# Patient Record
Sex: Female | Born: 1970 | Race: Black or African American | Hispanic: No | Marital: Single | Smoking: Current every day smoker
Health system: Southern US, Community
[De-identification: ages and names within clinical notes are randomized; demographics above are authoritative.]

## PROBLEM LIST (undated history)

## (undated) DIAGNOSIS — B1920 Unspecified viral hepatitis C without hepatic coma: Secondary | ICD-10-CM

## (undated) DIAGNOSIS — C419 Malignant neoplasm of bone and articular cartilage, unspecified: Secondary | ICD-10-CM

## (undated) DIAGNOSIS — I10 Essential (primary) hypertension: Secondary | ICD-10-CM

## (undated) DIAGNOSIS — C801 Malignant (primary) neoplasm, unspecified: Secondary | ICD-10-CM

## (undated) HISTORY — PX: LEG SURGERY: SHX1003

## (undated) HISTORY — PX: HYSTEROTOMY: SHX1776

## (undated) HISTORY — DX: Malignant neoplasm of bone and articular cartilage, unspecified: C41.9

## (undated) HISTORY — DX: Malignant (primary) neoplasm, unspecified: C80.1

## (undated) HISTORY — DX: Unspecified viral hepatitis C without hepatic coma: B19.20

---

## 2019-02-06 ENCOUNTER — Inpatient Hospital Stay (HOSPITAL_COMMUNITY)
Admission: EM | Admit: 2019-02-06 | Discharge: 2019-02-12 | DRG: 123 | Disposition: A | Discharge status: Home / Self Care | Payer: Self-pay | Source: Ambulatory Visit | Attending: Family Medicine | Admitting: Family Medicine

## 2019-02-06 ENCOUNTER — Emergency Department (HOSPITAL_COMMUNITY): Payer: Medicaid Other

## 2019-02-06 ENCOUNTER — Other Ambulatory Visit: Payer: Self-pay

## 2019-02-06 ENCOUNTER — Encounter (HOSPITAL_COMMUNITY): Payer: Self-pay

## 2019-02-06 DIAGNOSIS — R7982 Elevated C-reactive protein (CRP): Secondary | ICD-10-CM | POA: Diagnosis present

## 2019-02-06 DIAGNOSIS — H4710 Unspecified papilledema: Secondary | ICD-10-CM | POA: Diagnosis present

## 2019-02-06 DIAGNOSIS — Z6830 Body mass index (BMI) 30.0-30.9, adult: Secondary | ICD-10-CM

## 2019-02-06 DIAGNOSIS — F1190 Opioid use, unspecified, uncomplicated: Secondary | ICD-10-CM

## 2019-02-06 DIAGNOSIS — F1120 Opioid dependence, uncomplicated: Secondary | ICD-10-CM

## 2019-02-06 DIAGNOSIS — E669 Obesity, unspecified: Secondary | ICD-10-CM | POA: Diagnosis present

## 2019-02-06 DIAGNOSIS — F1121 Opioid dependence, in remission: Secondary | ICD-10-CM | POA: Diagnosis present

## 2019-02-06 DIAGNOSIS — I10 Essential (primary) hypertension: Secondary | ICD-10-CM | POA: Diagnosis present

## 2019-02-06 DIAGNOSIS — H543 Unqualified visual loss, both eyes: Secondary | ICD-10-CM

## 2019-02-06 DIAGNOSIS — Z833 Family history of diabetes mellitus: Secondary | ICD-10-CM

## 2019-02-06 DIAGNOSIS — H5010 Unspecified exotropia: Secondary | ICD-10-CM | POA: Diagnosis present

## 2019-02-06 DIAGNOSIS — H469 Unspecified optic neuritis: Principal | ICD-10-CM | POA: Diagnosis present

## 2019-02-06 DIAGNOSIS — Z20828 Contact with and (suspected) exposure to other viral communicable diseases: Secondary | ICD-10-CM | POA: Diagnosis present

## 2019-02-06 DIAGNOSIS — L508 Other urticaria: Secondary | ICD-10-CM

## 2019-02-06 DIAGNOSIS — K760 Fatty (change of) liver, not elsewhere classified: Secondary | ICD-10-CM | POA: Diagnosis present

## 2019-02-06 DIAGNOSIS — H532 Diplopia: Secondary | ICD-10-CM | POA: Diagnosis present

## 2019-02-06 DIAGNOSIS — R748 Abnormal levels of other serum enzymes: Secondary | ICD-10-CM | POA: Diagnosis present

## 2019-02-06 DIAGNOSIS — E279 Disorder of adrenal gland, unspecified: Secondary | ICD-10-CM | POA: Diagnosis present

## 2019-02-06 DIAGNOSIS — H53483 Generalized contraction of visual field, bilateral: Secondary | ICD-10-CM | POA: Diagnosis present

## 2019-02-06 DIAGNOSIS — L500 Allergic urticaria: Secondary | ICD-10-CM | POA: Diagnosis not present

## 2019-02-06 DIAGNOSIS — H9319 Tinnitus, unspecified ear: Secondary | ICD-10-CM | POA: Diagnosis present

## 2019-02-06 DIAGNOSIS — F17210 Nicotine dependence, cigarettes, uncomplicated: Secondary | ICD-10-CM | POA: Diagnosis present

## 2019-02-06 HISTORY — DX: Essential (primary) hypertension: I10

## 2019-02-06 LAB — CBC WITH DIFFERENTIAL/PLATELET
Abs Immature Granulocytes: 0.02 10*3/uL (ref 0.00–0.07)
Basophils Absolute: 0 10*3/uL (ref 0.0–0.1)
Basophils Relative: 0 %
Eosinophils Absolute: 0.2 10*3/uL (ref 0.0–0.5)
Eosinophils Relative: 2 %
HCT: 40.4 % (ref 36.0–46.0)
Hemoglobin: 13.3 g/dL (ref 12.0–15.0)
Immature Granulocytes: 0 %
Lymphocytes Relative: 32 %
Lymphs Abs: 2.8 10*3/uL (ref 0.7–4.0)
MCH: 31.1 pg (ref 26.0–34.0)
MCHC: 32.9 g/dL (ref 30.0–36.0)
MCV: 94.6 fL (ref 80.0–100.0)
Monocytes Absolute: 0.6 10*3/uL (ref 0.1–1.0)
Monocytes Relative: 6 %
Neutro Abs: 5.3 10*3/uL (ref 1.7–7.7)
Neutrophils Relative %: 60 %
Platelets: 290 10*3/uL (ref 150–400)
RBC: 4.27 MIL/uL (ref 3.87–5.11)
RDW: 13.7 % (ref 11.5–15.5)
WBC: 8.9 10*3/uL (ref 4.0–10.5)
nRBC: 0 % (ref 0.0–0.2)

## 2019-02-06 LAB — COMPREHENSIVE METABOLIC PANEL
ALT: 40 U/L (ref 0–44)
AST: 45 U/L — ABNORMAL HIGH (ref 15–41)
Albumin: 3.9 g/dL (ref 3.5–5.0)
Alkaline Phosphatase: 187 U/L — ABNORMAL HIGH (ref 38–126)
Anion gap: 10 (ref 5–15)
BUN: 10 mg/dL (ref 6–20)
CO2: 26 mmol/L (ref 22–32)
Calcium: 9.3 mg/dL (ref 8.9–10.3)
Chloride: 100 mmol/L (ref 98–111)
Creatinine, Ser: 0.79 mg/dL (ref 0.44–1.00)
GFR calc Af Amer: >60 mL/min (ref >60)
GFR calc non Af Amer: >60 mL/min (ref >60)
Glucose, Bld: 105 mg/dL — ABNORMAL HIGH (ref 70–99)
Potassium: 3.5 mmol/L (ref 3.5–5.1)
Sodium: 136 mmol/L (ref 135–145)
Total Bilirubin: 0.9 mg/dL (ref 0.3–1.2)
Total Protein: 8.5 g/dL — ABNORMAL HIGH (ref 6.5–8.1)

## 2019-02-06 LAB — SEDIMENTATION RATE: Sed Rate: 55 mm/hr — ABNORMAL HIGH (ref 0–22)

## 2019-02-06 LAB — C-REACTIVE PROTEIN: CRP: 1.3 mg/dL — ABNORMAL HIGH (ref <1.0)

## 2019-02-06 MED ORDER — ENOXAPARIN SODIUM 40 MG/0.4ML ~~LOC~~ SOLN
40.0000 mg | SUBCUTANEOUS | Status: DC
  Administered 2019-02-08 – 2019-02-12 (×5): 40 mg via SUBCUTANEOUS
  Filled 2019-02-06 (×5): qty 0.4

## 2019-02-06 MED ORDER — ACETAMINOPHEN 650 MG RE SUPP: 650.0000 mg | Freq: Four times a day (QID) | RECTAL | Status: DC | PRN

## 2019-02-06 MED ORDER — METHADONE HCL 40 MG PO TBSO: 120.0000 mg | ORAL_TABLET | Freq: Every day | ORAL | Status: DC

## 2019-02-06 MED ORDER — GADOBUTROL 1 MMOL/ML IV SOLN
7.0000 mL | Freq: Once | INTRAVENOUS | Status: AC | PRN
  Administered 2019-02-06: 7 mL via INTRAVENOUS

## 2019-02-06 MED ORDER — IOHEXOL 300 MG/ML  SOLN
75.0000 mL | Freq: Once | INTRAMUSCULAR | Status: AC | PRN
  Administered 2019-02-06: 75 mL via INTRAVENOUS

## 2019-02-06 MED ORDER — POLYETHYLENE GLYCOL 3350 17 G PO PACK: 17.0000 g | PACK | Freq: Every day | ORAL | Status: DC | PRN

## 2019-02-06 MED ORDER — ACETAMINOPHEN 325 MG PO TABS
650.0000 mg | ORAL_TABLET | Freq: Four times a day (QID) | ORAL | Status: DC | PRN
  Administered 2019-02-07 – 2019-02-09 (×3): 650 mg via ORAL
  Filled 2019-02-06 (×3): qty 2

## 2019-02-06 NOTE — ED Notes (Signed)
Dr. Leonel Ramsay has not seen pt and referred me to Margarita Mail.

## 2019-02-06 NOTE — ED Notes (Signed)
Consent for Dr. Ashok Cordia placed in Medical records drawer

## 2019-02-06 NOTE — ED Notes (Signed)
Patient transported to MRI 

## 2019-02-06 NOTE — ED Notes (Signed)
Notified Mike Craze

## 2019-02-06 NOTE — ED Notes (Signed)
Two unsuccessful IV attempts. Pt has IV drug use hx

## 2019-02-06 NOTE — ED Notes (Signed)
Radiology called and indicated they had not been able to reach Dr Ashok Cordia and that he would need to call Dr Nyoka Cowden at [phone]. Attempting to find who care of this pt was handed off to so that I can let them know.

## 2019-02-06 NOTE — ED Provider Notes (Signed)
[facility] Provider Note   CSN: [id number] Arrival date & time: 02/06/19  0818     History   Chief Complaint No chief complaint on file.   HPI Yanet Balliet is a 48 y.o. female presents for evaluation of gradual onset, progressively worsening vision changes.  She reports that around 1 month ago she developed diplopia in the right eye which is now progressed to the left eye.  She reports that she has decreased/absent peripheral vision bilaterally.  Denies eye pain, headaches, numbness or weakness of the extremities, chest pain, shortness of breath, abdominal pain, nausea, or vomiting.  She does tell me that her vision changes acutely worsened 1 week ago.  She was seen by her ophthalmologist Dr. Manuella Ghazi yesterday who obtained blood work and reevaluated her today.  She reports that "he did not like that some of my blood work was abnormal "and he sent her here for further evaluation and imaging.  Per paperwork brought in by the patient from Dr. Trena Platt office, the patient was actually initially seen on 10/23/2018 with bilateral disc edema, right worse than left.  She was originally just to show up for follow-up 3 weeks later but canceled.  She is to get blood work and then followed up yesterday.  On yesterday's exam the patient had disc pallor on the right from chronic disc edema and new and worsening disc edema on the left.  Also noted to have progressive visual field loss.  Blood work positive for RF and ACE with elevated calcium and alk phos concerning for sarcoidosis as cause.  She was sent to the ED for MRI of brain and orbit with and without contrast to rule out neurosarcoidosis, mass, or other acute abnormality.  He recommends neurology consult and possible admission to the hospital for IV steroids.     The history is provided by the patient and medical records.    History reviewed. No pertinent past medical history.  Patient Active Problem List   Diagnosis Date Noted   Visual loss, both eyes 02/06/2019    History reviewed. No pertinent surgical history.   OB History   No obstetric history on file.      Home Medications    Prior to Admission medications   Medication Sig Start Date End Date Taking? Authorizing Provider  acetaminophen (TYLENOL) 500 MG tablet Take 1,000 mg by mouth every 6 (six) hours as needed for headache (pain).   Yes [provider]  APPLE CIDER VINEGAR PO Take 1 tablet by mouth daily.   Yes [provider]  METHADONE HCL PO Take 120 mg by mouth daily.   Yes [provider]  Multiple Vitamin (MULTIVITAMIN WITH MINERALS) TABS tablet Take 1 tablet by mouth daily.   Yes [provider]    Family History No family history on file.  Social History Social History   Tobacco Use   Smoking status: Not on file  Substance Use Topics   Alcohol use: Not on file   Drug use: Not on file     Allergies   Shellfish allergy   Review of Systems Review of Systems  Constitutional: Negative for chills and fever.  Eyes: Positive for visual disturbance. Negative for photophobia.  Respiratory: Negative for shortness of breath.   Cardiovascular: Negative for chest pain.  Gastrointestinal: Negative for abdominal pain, nausea and vomiting.  Neurological: Negative for weakness, light-headedness, numbness and headaches.  All other systems reviewed and are negative.    Physical  Exam Updated Vital Signs BP (!) 142/78 (BP Location: Right Arm)    Pulse 78    Temp 98.8 F (37.1 C) (Oral)    Resp 16    Ht 5' 7" (1.702 m)    Wt 89.2 kg    SpO2 99%    BMI 30.80 kg/m   Physical Exam Vitals signs and nursing note reviewed.  Constitutional:      General: She is not in acute distress.    Appearance: She is well-developed.  HENT:     Head: Normocephalic and atraumatic.  Eyes:     General:        Right eye: No discharge.        Left eye: No discharge.     Extraocular Movements:  Extraocular movements intact.     Conjunctiva/sclera: Conjunctivae normal.     Pupils: Pupils are equal, round, and reactive to light.     Comments: No chemosis, proptosis, or consensual photophobia.  No restriction or pain with EOMs.  Bilateral peripheral field deficit noted.  Neck:     Vascular: No JVD.     Trachea: No tracheal deviation.  Cardiovascular:     Rate and Rhythm: Normal rate.  Pulmonary:     Effort: Pulmonary effort is normal.  Abdominal:     General: There is no distension.  Skin:    General: Skin is warm and dry.     Findings: No erythema.  Neurological:     Mental Status: She is alert.  Psychiatric:        Behavior: Behavior normal.      ED Treatments / Results  Labs (all labs ordered are listed, but only abnormal results are displayed) Labs Reviewed  COMPREHENSIVE METABOLIC PANEL - Abnormal; Notable for the following components:      Result Value   Glucose, Bld 105 (*)    Total Protein 8.5 (*)    AST 45 (*)    Alkaline Phosphatase 187 (*)    All other components within normal limits  SEDIMENTATION RATE - Abnormal; Notable for the following components:   Sed Rate 55 (*)    All other components within normal limits  C-REACTIVE PROTEIN - Abnormal; Notable for the following components:   CRP 1.3 (*)    All other components within normal limits  COMPREHENSIVE METABOLIC PANEL - Abnormal; Notable for the following components:   Potassium 3.2 (*)    Glucose, Bld 101 (*)    Alkaline Phosphatase 170 (*)    All other components within normal limits  SARS CORONAVIRUS 2 (TAT 6-24 HRS)  CSF CULTURE  GRAM STAIN  CBC WITH DIFFERENTIAL/PLATELET  GLUCOSE, CSF  PROTEIN, CSF  ANGIOTENSIN CONVERTING ENZYME, CSF  CSF CELL COUNT WITH DIFFERENTIAL  CSF CELL COUNT WITH DIFFERENTIAL  HIV ANTIBODY (ROUTINE TESTING W REFLEX)  VDRL, CSF  RPR  GAMMA GT    EKG None  Radiology Ct Angio Head W Or Wo Contrast  Result Date: 02/07/2019 CLINICAL DATA:  Concern for  vasculitis.  Blurry vision for 2 months. EXAM: CT ANGIOGRAPHY HEAD TECHNIQUE: Multidetector CT imaging of the head was performed using the standard protocol during bolus administration of intravenous contrast. Multiplanar CT image reconstructions and MIPs were obtained to evaluate the vascular anatomy. CONTRAST:  64m OMNIPAQUE IOHEXOL 350 MG/ML SOLN COMPARISON:  Brain MRI 02/06/2019 FINDINGS: POSTERIOR CIRCULATION: --Vertebral arteries: Diminutive right vertebral artery terminates in PICA. Left V4 segment is normal. --Posterior inferior cerebellar arteries (PICA): Normal --Anterior inferior cerebellar arteries (AICA): Patent origins  from the basilar artery. --Basilar artery: Normal. --Superior cerebellar arteries: Normal. --Posterior cerebral arteries: Normal. Both originate from the basilar artery. Posterior communicating arteries (p-comm) are diminutive or absent. ANTERIOR CIRCULATION: --Intracranial internal carotid arteries: Normal. --Anterior cerebral arteries (ACA): Normal. Both A1 segments are present. Patent anterior communicating artery (a-comm). --Middle cerebral arteries (MCA): Normal. Trace amount of air in the right lateral ventricle frontal horn. This is an incidental finding that may be seen following lumbar puncture. IMPRESSION: 1. No evidence for CNS vasculitis. 2. No emergent large vessel occlusion or flow-limiting stenosis. Electronically Signed   By: Ulyses Jarred M.D.   On: 02/07/2019 03:08   Ct Chest W Contrast  Result Date: 02/06/2019 CLINICAL DATA:  Sarcoid. EXAM: CT CHEST WITH CONTRAST TECHNIQUE: Multidetector CT imaging of the chest was performed during intravenous contrast administration. CONTRAST:  77m OMNIPAQUE IOHEXOL 300 MG/ML  SOLN COMPARISON:  None. FINDINGS: Cardiovascular: Heart size is normal. There is no significant pericardial effusion. The thoracic aorta is unremarkable. Mediastinum/Nodes: --No mediastinal or hilar lymphadenopathy. --No axillary lymphadenopathy. --No  supraclavicular lymphadenopathy. --Normal thyroid gland. --The esophagus is unremarkable Lungs/Pleura: No pulmonary nodules or masses. No pleural effusion or pneumothorax. No focal airspace consolidation. No focal pleural abnormality. Upper Abdomen: There is hepatic steatosis. There is a 2.6 x 2.1 cm left adrenal mass. Musculoskeletal: No chest wall abnormality. No acute or significant osseous findings. Review of the MIP images confirms the above findings. IMPRESSION: 1. No acute abnormality detected.  No evidence for sarcoidosis. 2. Hepatic steatosis. 3. 2.6 x 2.1 cm left adrenal mass, incompletely characterized on this single phase study. Further evaluation with a nonemergent outpatient adrenal protocol CT or MRI is recommended. Electronically Signed   By: CConstance HolsterM.D.   On: 02/06/2019 22:06   Mr BJeri CosAnd Wo Contrast  Result Date: 02/06/2019 CLINICAL DATA:  48year old female with 2 months of blurred vision. Referred by ophthalmology, possible neurosarcoidosis. EXAM: MRI HEAD AND ORBITS WITHOUT AND WITH CONTRAST TECHNIQUE: Multiplanar, multiecho pulse sequences of the brain and surrounding structures were obtained without and with intravenous contrast. Multiplanar, multiecho pulse sequences of the orbits and surrounding structures were obtained including fat saturation techniques, before and after intravenous contrast administration. CONTRAST:  759mGADAVIST GADOBUTROL 1 MMOL/ML IV SOLN COMPARISON:  Report of 09/29/1999 sinus CT (no images available). FINDINGS: MRI HEAD FINDINGS Brain: Partially empty sella. No convincing restricted diffusion. No midline shift, mass effect, or evidence of intracranial mass lesion. Prominent ventricle size for age, although the temporal horns remain diminutive. No chronic cerebral blood products identified, and no definite cortical encephalomalacia. However there is widespread abnormal cerebral white matter T2 and FLAIR hyperintensity, with similar signal  heterogeneity in the bilateral deep gray nuclei. Mild patchy T2 hyperintensity in the pons more so on the left. No cortical edema/gyral swelling identified. No abnormal enhancement identified.  No dural thickening identified. Vascular: Major intracranial vascular flow voids are preserved; the distal left vertebral artery appears dominant and the right diminutive or absent. Major dural venous sinuses are enhancing and appear to be patent. Skull and upper cervical spine: Negative visible cervical spine aside from degenerative disc bulging. Mildly heterogeneous marrow signal is within normal limits. Degenerative synovial type cyst of the left TMJ incidentally noted (7 millimeters series 24, image 3). Other: Visible internal auditory structures appear normal. Mastoids are clear. MRI ORBITS FINDINGS Orbits: No suprasellar mass effect. The optic chiasm appears normal. The optic nerves appear symmetric and without abnormal T2 hyperintensity or enhancement of the substance of  the nerves. There is bilateral optic nerve root sleeve CSF. No intraorbital inflammation or mass. Lacrimal glands and extraocular muscles appear symmetric and within normal limits. Normal cavernous sinus. Globes appear symmetric and normal aside from mildly disconjugate gaze. Soft tissues: Superficial and deep soft tissue spaces of the face appear negative. IMPRESSION: 1. Essentially negative MRI appearance of the orbits (mildly disconjugate gaze, and CSF in both optic nerve root sleeves, see #3). 2. Highly advanced for age but nonspecific signal abnormality scattered in the bilateral cerebral white matter and deep gray nuclei. Comparatively minor involvement of the brainstem. No associated enhancement or dural thickening, arguing against neurosarcoidosis. Although CSF analysis for Ace level may be valuable. Other differential considerations include accelerated/hereditary small vessel ischemia, sequelae of hypercoagulable state, vasculitis, prior  infection or less likely demyelination. 3. Superimposed partially empty sella appearance, along with an effaced appearance of the transverse/sigmoid sinus junctions and capacious optic nerve root sleeves. Consider also idiopathic intracranial hypertension (pseudotumor cerebri). Electronically Signed   By: Genevie Ann M.D.   On: 02/06/2019 18:53   Mr Rosealee Albee YB Contrast  Result Date: 02/06/2019 CLINICAL DATA:  48 year old female with 2 months of blurred vision. Referred by ophthalmology, possible neurosarcoidosis. EXAM: MRI HEAD AND ORBITS WITHOUT AND WITH CONTRAST TECHNIQUE: Multiplanar, multiecho pulse sequences of the brain and surrounding structures were obtained without and with intravenous contrast. Multiplanar, multiecho pulse sequences of the orbits and surrounding structures were obtained including fat saturation techniques, before and after intravenous contrast administration. CONTRAST:  18m GADAVIST GADOBUTROL 1 MMOL/ML IV SOLN COMPARISON:  Report of 09/29/1999 sinus CT (no images available). FINDINGS: MRI HEAD FINDINGS Brain: Partially empty sella. No convincing restricted diffusion. No midline shift, mass effect, or evidence of intracranial mass lesion. Prominent ventricle size for age, although the temporal horns remain diminutive. No chronic cerebral blood products identified, and no definite cortical encephalomalacia. However there is widespread abnormal cerebral white matter T2 and FLAIR hyperintensity, with similar signal heterogeneity in the bilateral deep gray nuclei. Mild patchy T2 hyperintensity in the pons more so on the left. No cortical edema/gyral swelling identified. No abnormal enhancement identified.  No dural thickening identified. Vascular: Major intracranial vascular flow voids are preserved; the distal left vertebral artery appears dominant and the right diminutive or absent. Major dural venous sinuses are enhancing and appear to be patent. Skull and upper cervical spine: Negative  visible cervical spine aside from degenerative disc bulging. Mildly heterogeneous marrow signal is within normal limits. Degenerative synovial type cyst of the left TMJ incidentally noted (7 millimeters series 24, image 3). Other: Visible internal auditory structures appear normal. Mastoids are clear. MRI ORBITS FINDINGS Orbits: No suprasellar mass effect. The optic chiasm appears normal. The optic nerves appear symmetric and without abnormal T2 hyperintensity or enhancement of the substance of the nerves. There is bilateral optic nerve root sleeve CSF. No intraorbital inflammation or mass. Lacrimal glands and extraocular muscles appear symmetric and within normal limits. Normal cavernous sinus. Globes appear symmetric and normal aside from mildly disconjugate gaze. Soft tissues: Superficial and deep soft tissue spaces of the face appear negative. IMPRESSION: 1. Essentially negative MRI appearance of the orbits (mildly disconjugate gaze, and CSF in both optic nerve root sleeves, see #3). 2. Highly advanced for age but nonspecific signal abnormality scattered in the bilateral cerebral white matter and deep gray nuclei. Comparatively minor involvement of the brainstem. No associated enhancement or dural thickening, arguing against neurosarcoidosis. Although CSF analysis for Ace level may be valuable. Other differential considerations  include accelerated/hereditary small vessel ischemia, sequelae of hypercoagulable state, vasculitis, prior infection or less likely demyelination. 3. Superimposed partially empty sella appearance, along with an effaced appearance of the transverse/sigmoid sinus junctions and capacious optic nerve root sleeves. Consider also idiopathic intracranial hypertension (pseudotumor cerebri). Electronically Signed   By: Genevie Ann M.D.   On: 02/06/2019 18:53    Procedures Procedures (including critical care time)  Medications Ordered in ED Medications  enoxaparin (LOVENOX) injection 40 mg (0  mg Subcutaneous Hold 02/07/19 1000)  acetaminophen (TYLENOL) tablet 650 mg (has no administration in time range)    Or  acetaminophen (TYLENOL) suppository 650 mg (has no administration in time range)  polyethylene glycol (MIRALAX / GLYCOLAX) packet 17 g (has no administration in time range)  methadone (DOLOPHINE) tablet 120 mg (has no administration in time range)  potassium chloride SA (K-DUR) CR tablet 40 mEq (has no administration in time range)  lidocaine (PF) (XYLOCAINE) 1 % injection (has no administration in time range)  gadobutrol (GADAVIST) 1 MMOL/ML injection 7 mL (7 mLs Intravenous Contrast Given 02/06/19 1618)  iohexol (OMNIPAQUE) 300 MG/ML solution 75 mL (75 mLs Intravenous Contrast Given 02/06/19 2145)  iohexol (OMNIPAQUE) 350 MG/ML injection 75 mL (75 mLs Intravenous Contrast Given 02/07/19 0253)     Initial Impression / Assessment and Plan / ED Course  I have reviewed the triage vital signs and the nursing notes.  Pertinent labs & imaging results that were available during my care of the patient were reviewed by me and considered in my medical decision making (see chart for details).        Patient presenting sent from ophthalmologist for evaluation of progressively worsening bilateral vision loss reportedly worsening this week.  She is afebrile, persistently hypertensive in the ED.  Vital signs otherwise stable.  She is nontoxic in appearance.  She has peripheral field deficit reports blurred vision and diplopia bilaterally.  States that she is "bumping into things ".  Tells me that she waited to have her symptoms evaluated because she was afraid of what was causing her symptoms.  Will obtain lab work and order MRI brain and orbits with and without contrast as recommended by ophthalmology.  MRI shows highly advanced for age but nonspecific signal abnormality scattered in the bilateral cerebral white matter and deep gray nuclei, no diagnostic findings suggestive of  neurosarcoidosis.   7:47 PM CONSULT: Spoke with Dr. Leonel Ramsay with neurology who reviewed the patient's images; advises that the patient's findings are nonspecific.  He recommends obtaining an LP to obtain CSF ACE and opening pressure.  If opening pressure is high, would be reasonable to start acetazolamide 500 mg twice daily.  He recommends follow-up with Dr. Arlice Colt with neurology.   On reevaluation patient resting comfortably.  She is frustrated that we have not found a definite source of her vision loss.  She states that she does not have anyone to take her home and that it is unsafe for her to ambulate due to her vision loss.  Dr. Ashok Cordia attempted LP without success.  Will need to be performed by IR.  9:00PM Signed out care to oncoming provider PA Harris.  Pending LP by IR.  May require admission or at least case management/social work consult due to poor social support and lack of resources and unsafe conditions due to her vision changes.  Final Clinical Impressions(s) / ED Diagnoses   Final diagnoses:  Vision loss, bilateral    ED Discharge Orders    None  Renita Papa, PA-C 02/07/19 1109    Lajean Saver, MD 02/07/19 [id number]

## 2019-02-06 NOTE — ED Notes (Signed)
Admitting provider at bedside.

## 2019-02-06 NOTE — Consult Note (Signed)
Neurology Consultation Reason for Consult: Visual change Referring Physician: Amada Kingfisher  CC: Visual change  History is obtained from: Patient  HPI: Annette Jennings is a 47 y.o. female presenting with a history of several months of visual decline, though much more pronounced over the past couple of weeks.  She saw Dr. Manuella Ghazi a couple of months ago, however but was lost to follow-up due to the fact that she did not yet have Medicaid and she was awaiting this.  When she did get Medicaid she resumed seeing him he noticed optic nerve head edema.  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   ROS: A 14 point ROS was performed and is negative except as noted in the HPI.   Past medical history:  History of drug abuse, currently in remission  Family history: No history of autoimmune disease that she knows of  Social History: She does have a history of IV drug abuse, as well as alcohol, but has not used in the past year She does continue to smoke  Exam: Current vital signs: BP (!) 169/101   Pulse 81   Temp 98.5 F (36.9 C) (Oral)   Resp 20   SpO2 96%  Vital signs in last 24 hours: Temp:  [98.5 F (36.9 C)] 98.5 F (36.9 C) (09/19 0828) Pulse Rate:  [78-89] 81 (09/19 2230) Resp:  [18-20] 20 (09/19 1400) BP: (139-179)/(97-115) 169/101 (09/19 2232) SpO2:  [89 %-100 %] 96 % (09/19 2230)   Physical Exam  Constitutional: Appears well-developed and well-nourished.  Psych: Affect appropriate to situation Eyes: No scleral injection HENT: No OP obstrucion Head: Normocephalic.  Cardiovascular: Normal rate and regular rhythm.  Respiratory: Effort normal, non-labored breathing GI: Soft.  No distension. There is no tenderness.  Skin: WDI  Neuro: Mental Status: Patient is awake, alert, oriented to person, place, month, year, and situation. Patient is able to give a clear and coherent history. No signs of aphasia or neglect Cranial Nerves: II: She has severe  restriction of fields, essentially to the fovea, bilaterally. Pupils are equal, round, and reactive to light.   III,IV, VI: She has a right exotropia, but when tested independently both eyes have full EOM V: Facial sensation is symmetric to temperature VII: Facial movement is symmetric.  VIII: hearing is intact to voice X: Uvula elevates symmetrically XI: Shoulder shrug is symmetric. XII: tongue is midline without atrophy or fasciculations.  Motor: Tone is normal. Bulk is normal. 5/5 strength was present in all four extremities.  Sensory: Sensation is symmetric to light touch and temperature in the arms and legs. Deep Tendon Reflexes: 2+ and symmetric in the biceps and patellae.  Cerebellar: FNF intact bilaterally  I have reviewed labs from Dr. Manuella Ghazi and the results pertinent to this consultation are: Ana,dsdna, rnp, smith, anti scl70, ssa, ssb, antichromatin, jo-1,centromere b, bartonella, cmv were sent and are pending  RF 14.5, upper limit normal 13.9 ACE 103, ULN 82  Here an ESR, CRP were checked, ESR is 55 and CRP is 1.3  I have reviewed the images obtained: MRI brain-optic nerve edema with partially empty sella.  Impression: 48 yo F with bilateral optic nerve edema with constriction of visual fields.  Though she does not have headaches, I do think idiopathic intracranial hypertension needs to be considered, and an LP was attempted in the ER but was unsuccessful.  This is now scheduled for the morning with radiology.  Without typical imaging changes, or findings on chest CT, I  think sarcoidosis is slightly less likely, however see if assays would be reasonable.  With her history of IV drug abuse, I do think that syphilis is a consideration and ocular manifestations can present as papilledema.  This can also cause extensive T2 changes well.  She does have hypertension, and therefore T2 changes may be simply due to underlying small vessel disease.  With the possibility raised of a  vasculitis, I do think a CTA head would be reasonable, though without headaches, I think this is much less likely.  Other autoimmune disease is possible, and she had an extensive serological evaluation sent by her ophthalmologist.  Lyme and Bartonella have also been sent.  Recommendations: 1) CTA head 2) LP for opening pressure, cells, glucose, CSF ACE, CSF VDRL 3) await results of Lyme titers, Bartonella titers 4) RPR, HIV  Roland Rack, MD [facility] [phone]  If 7pm- 7am, please page neurology on call as listed in [place].

## 2019-02-06 NOTE — ED Notes (Signed)
EDP, Dr. Ashok Cordia attempted two LP before ordering assistance from Radiology.

## 2019-02-06 NOTE — ED Provider Notes (Signed)
9:50 PM  Patient taken signout from [place].  Here with worsening vision problems over 3 weeks, acutely worsening over the past week and having significant visual deficits.  She was sent in by Dr. Manuella Ghazi for further management who is concern for potential ocular sarcoid.  Patient has been seen by Dr. Leonel Ramsay who recommends LP. EDPs attempted twice without success and patient will need IR lumbar puncture.    Spoke with Dr. Nyoka Cowden who is on-call for interventional radiology.  He has that the patient be admitted and he will do the LP in the morning.  Considering her multiple issues including her sudden loss of vision over the past week, poor social network patient will need likely OT PT evaluations.  She lives by herself. I have informed Dr. Leonel Ramsay that she will be admitted by the family medicine service overnight.  Neurology will follow along with the patient.   Margarita Mail, PA-C 02/07/19 0031    Quintella Reichert, MD 02/08/19 [phone]

## 2019-02-06 NOTE — H&P (Signed)
[facility] Admission History and Physical Service Pager: [phone]  Patient name: Annette Jennings Medical record number: [id number] Date of birth: 1970-09-07 Age: 48 y.o. Gender: female  Primary Care Provider: Patient, No Pcp Per Consultants: IR, neurology Code Status: Full Preferred Emergency Contact: Annette Jennings, brother, [phone]  Chief Complaint: Visual changes  Assessment and Plan: Annette Jennings is a 48 y.o. female presenting with gradual onset, progressively worsening bilateral vision loss. PMH is significant for elevated blood pressure, previous substance abuse, chronic methadone use.   Bilateral Vision Loss w/ Optic Disc Edema: Acute on chronic, progressively worsening, gradual.  Several month history of R peripheral vision loss with acute 1 week history of worsening L visual changes, in the setting of known bilateral disc edema on ophthalmology evaluation. Worsening edema noted in clinic yesterday with RF and ACE positive through labs performed by ophthalmology team, sent to ED today for brain imaging with concerns for sarcoidosis. Reassuringly, neurologically intact on exam with preserved extraocular motion and reactive pupils bilaterally. Tunnel vision on visual field testing.  CRP and ESR elevated.  MRI brain/orbits with normal appearance of orbits and no masses, ruling out precipitating intracranial mass.  However, did note nonspecific signal abnormality scattered throughout cerebral white matter and deep gray nuclei that could be concerning for sarcoidosis, but without any concurrent enhancement or dural thickening making neurosarcoidosis less likely.  Additionally, CT chest without evidence of sarcoidosis.  Could consider pseudotumor cerebri with worsening optic disc edema/visual changes, superimposed partially empty sella appearance on MRI, and no appreciable etiology on imaging thus far, but would have expected associated headaches.  Less  likely small vessel ischemia, vasculitis, previous infection as indicated through imaging, however will remain on differential.  No concern for precipitating CVA. Will proceed with LP to further assess for infection, sarcoidosis, and IIH.   - Admit to [facility], attending Dr. Erin Jennings - IR consulted in the ED for LP, will be performed tomorrow morning, 9/20 - Neurology, Dr. Leonel Jennings, consulted in the ED, will evaluate pending LP results - CSF culture/protein/cell count/glucose and CSF ACE orders already placed - Monitor visual changes - OT eval  Elevated blood pressure: Suspect chronic.  SBP 140-170s since arrival.  Asymptomatic. Low suspicion for longstanding hypertension precipitating visual changes as above.  Renal function preserved, Cr 0.79 on admit.  No previous diagnosis of hypertension, however has been elevated in the past per patient's report via ED visits.  Suspect chronic, however will continue to monitor. - Monitor BP - If remains elevated, will consider starting controlling therapy on 9/20 - CMP in a.m. - Case management consult for establishing PCP  Previous substance abuse, methadone use: Stable. 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  Daily methadone use through [facility] in [place]. - Dosage verified with clinic through pharmacy team on 9/19 - Methadone 120 mg nightly  Left adrenal mass on CT: Incidental finding of a 2.6 x 2.1 cm left adrenal mass.  - Recommend CT/MRI outpatient for further evaluation   Elevated alkaline phosphatase: Alk phos 187. Will repeat fasting tomorrow am, 9/20.  If persistently elevated, will consider obtaining GGT for further assessment.  Bilirubin and AST/ALT unremarkable.  Hepatic steatosis noted on limited imaging through CT chest.  Hepatic sarcoidosis will continue to remain on differential.  Tobacco use: Stable. Smokes 1-2 cigarettes daily.  Will hold on nicotine patch for now.  FEN/GI: Regular  diet Prophylaxis: Lovenox  Disposition: Admit to [facility], attending Dr. Erin Jennings  History of Present Illness:  Annette Jennings is a 48 y.o. female presenting for gradual onset of progressively worsening bilateral visual changes, after recently seen her ophthalmologist who recommended further evaluation in the ED for head imaging.    Patient reports she initially noted visual changes in her right eye around 09/2018. Evaluated by ophthalmologist, Dr. Manuella Jennings, at that time who noted bilateral disc edema, right worse than left.  She was was to follow-up a few weeks after this, however due to difficulties with her insurance was not able to make follow-up.  Fortunately, saw him yesterday after now noting worsening visual changes in her left eye for the past week.  He noted new and worsening edema on the left with remarkable blood work showing RF and ACE positive with elevated calcium/alk phos. recommended further brain imaging to rule out sarcoidosis, mass, etc as a cause.  In discussion of her visual changes, describes her visual field as "tunnel vision," as she can only see centrally in both eyes.  Both changes have been gradual in onset.  Sometimes she sees spots in her vision.  Denies any associated headaches, eye pain, Jennings changes, lightheadedness/dizziness, speech abnormalities, weakness/numbness/paresthesias of extremities, pre-/syncope, N/V/change in bowel movement, or CP/shortness of breath.  Does note occasional tinnitus.  No known family history of autoimmune or rheumatological conditions.  She otherwise during this time has been well.  Lives on her own, very nervous to be at home by herself with current eyesight.  Her brother lives in [place].  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  Follows with [facility] in [place] on a regular basis.  Denies any known medical problems, believes she was told  her blood pressure was elevated a year ago in the ED, has not been on any medications.  Does not have a PCP.  ED Course: Alert and hemodynamically stable on admit.  CMP, CBC, CRP/sed rate notable for alkaline phosphatase of 187, CRP 1.3, and sed rate of 55.  MRI brain and orbits completed showing negative MRI of orbits, superimposed partially empty sella appearance and nonspecific signal abnormality in bilateral cerebral white matter and deep gray nuclei.  Case was discussed with neurology, Dr. Leonel Jennings, and IR, Dr. Nyoka Cowden, who recommended lumbar puncture for further evaluation and CT chest to assess for sarcoidosis findings.  Unfortunately, LP could not be performed tonight and will be done tomorrow morning, 9/20.  Review Of Systems: Per HPI with the following additions:   Review of Systems  Constitutional: Negative for fever, malaise/fatigue and weight loss.  HENT: Positive for tinnitus. Negative for Jennings loss.   Eyes: Negative for pain, discharge and redness.       Periphery vision loss  Respiratory: Negative for cough, sputum production and shortness of breath.   Cardiovascular: Negative for chest pain, palpitations and leg swelling.  Gastrointestinal: Negative for abdominal pain, constipation, diarrhea, nausea and vomiting.  Genitourinary: Negative for dysuria.  Musculoskeletal: Negative for falls and myalgias.  Neurological: Negative for dizziness, focal weakness, seizures, loss of consciousness, weakness and headaches.    Patient Active Problem List   Diagnosis Date Noted  . Visual loss, both eyes 02/06/2019    Past Medical History: History reviewed. No pertinent past medical history.  Past Surgical History: History reviewed. No pertinent surgical history.  Social History: Social History   Tobacco Use  . Smoking status: Not on file  Substance Use Topics  . Alcohol use: Not on  file  . Drug use: Not on file   Additional social history: Smokes 1 to 2 cigarettes daily.   Denies any alcohol use.  Previous heroin use, has been in recovery for the past year, on chronic methadone. Please also refer to relevant sections of EMR.  Family History: No family history on file. Mother, passed away due to renal failure.  Father, diabetes.   Allergies and Medications: Allergies  Allergen Reactions  . Shellfish Allergy Hives and Other (See Comments)    Tightening of chest   No current facility-administered medications on file prior to encounter.    Current Outpatient Medications on File Prior to Encounter  Medication Sig Dispense Refill  . acetaminophen (TYLENOL) 500 MG tablet Take 1,000 mg by mouth every 6 (six) hours as needed for headache (pain).    . APPLE CIDER VINEGAR PO Take 1 tablet by mouth daily.    Marland Kitchen METHADONE HCL PO Take 120 mg by mouth daily.    . Multiple Vitamin (MULTIVITAMIN WITH MINERALS) TABS tablet Take 1 tablet by mouth daily.      Objective: BP (!) 169/101   Pulse 81   Temp 98.5 F (36.9 C) (Oral)   Resp 20   SpO2 96%  Exam: General: Alert, NAD HEENT: Mucous membranes moist.  EOMI, PERRLA.  Noted bilateral visual loss within superior/inferior, and peripheral fields to about 45 degrees.  No surrounding eyelid swelling, conjunctival injection, proptosis, or eye drainage.  Cardiac: RRR no m/g/r Lungs: Clear bilaterally, no increased work of breathing on room air Abdomen: soft, non-tender, normoactive BS Neuro: Alert and oriented.  Speech normal.  Able to follow commands.  No focal neuro deficits noted on exam, 5/5 upper and lower extremity strength. Msk: Moves all extremities spontaneously  Ext: Warm, dry, 2+ distal pulses, no edema  Psych: Normal mood and affect   Labs and Imaging: CBC BMET  Recent Labs  Lab 02/06/19 1327  WBC 8.9  HGB 13.3  HCT 40.4  PLT 290   Recent Labs  Lab 02/06/19 1327  NA 136  K 3.5  CL 100  CO2 26  BUN 10  CREATININE 0.79  GLUCOSE 105*  CALCIUM 9.3     Ct Chest W Contrast  Result Date:  02/06/2019 CLINICAL DATA:  Sarcoid. EXAM: CT CHEST WITH CONTRAST TECHNIQUE: Multidetector CT imaging of the chest was performed during intravenous contrast administration. CONTRAST:  79m OMNIPAQUE IOHEXOL 300 MG/ML  SOLN COMPARISON:  None. FINDINGS: Cardiovascular: Heart size is normal. There is no significant pericardial effusion. The thoracic aorta is unremarkable. Mediastinum/Nodes: --No mediastinal or hilar lymphadenopathy. --No axillary lymphadenopathy. --No supraclavicular lymphadenopathy. --Normal thyroid gland. --The esophagus is unremarkable Lungs/Pleura: No pulmonary nodules or masses. No pleural effusion or pneumothorax. No focal airspace consolidation. No focal pleural abnormality. Upper Abdomen: There is hepatic steatosis. There is a 2.6 x 2.1 cm left adrenal mass. Musculoskeletal: No chest wall abnormality. No acute or significant osseous findings. Review of the MIP images confirms the above findings. IMPRESSION: 1. No acute abnormality detected.  No evidence for sarcoidosis. 2. Hepatic steatosis. 3. 2.6 x 2.1 cm left adrenal mass, incompletely characterized on this single phase study. Further evaluation with a nonemergent outpatient adrenal protocol CT or MRI is recommended. Electronically Signed   By: CConstance HolsterM.D.   On: 02/06/2019 22:06   Mr BJeri CosAnd Wo Contrast  Result Date: 02/06/2019 CLINICAL DATA:  48year old female with 2 months of blurred vision. Referred by ophthalmology, possible neurosarcoidosis. EXAM: MRI HEAD AND ORBITS  WITHOUT AND WITH CONTRAST TECHNIQUE: Multiplanar, multiecho pulse sequences of the brain and surrounding structures were obtained without and with intravenous contrast. Multiplanar, multiecho pulse sequences of the orbits and surrounding structures were obtained including fat saturation techniques, before and after intravenous contrast administration. CONTRAST:  23m GADAVIST GADOBUTROL 1 MMOL/ML IV SOLN COMPARISON:  Report of 09/29/1999 sinus CT (no  images available). FINDINGS: MRI HEAD FINDINGS Brain: Partially empty sella. No convincing restricted diffusion. No midline shift, mass effect, or evidence of intracranial mass lesion. Prominent ventricle size for age, although the temporal horns remain diminutive. No chronic cerebral blood products identified, and no definite cortical encephalomalacia. However there is widespread abnormal cerebral white matter T2 and FLAIR hyperintensity, with similar signal heterogeneity in the bilateral deep gray nuclei. Mild patchy T2 hyperintensity in the pons more so on the left. No cortical edema/gyral swelling identified. No abnormal enhancement identified.  No dural thickening identified. Vascular: Major intracranial vascular flow voids are preserved; the distal left vertebral artery appears dominant and the right diminutive or absent. Major dural venous sinuses are enhancing and appear to be patent. Skull and upper cervical spine: Negative visible cervical spine aside from degenerative disc bulging. Mildly heterogeneous marrow signal is within normal limits. Degenerative synovial type cyst of the left TMJ incidentally noted (7 millimeters series 24, image 3). Other: Visible internal auditory structures appear normal. Mastoids are clear. MRI ORBITS FINDINGS Orbits: No suprasellar mass effect. The optic chiasm appears normal. The optic nerves appear symmetric and without abnormal T2 hyperintensity or enhancement of the substance of the nerves. There is bilateral optic nerve root sleeve CSF. No intraorbital inflammation or mass. Lacrimal glands and extraocular muscles appear symmetric and within normal limits. Normal cavernous sinus. Globes appear symmetric and normal aside from mildly disconjugate gaze. Soft tissues: Superficial and deep soft tissue spaces of the face appear negative. IMPRESSION: 1. Essentially negative MRI appearance of the orbits (mildly disconjugate gaze, and CSF in both optic nerve root sleeves, see #3).  2. Highly advanced for age but nonspecific signal abnormality scattered in the bilateral cerebral white matter and deep gray nuclei. Comparatively minor involvement of the brainstem. No associated enhancement or dural thickening, arguing against neurosarcoidosis. Although CSF analysis for Ace level may be valuable. Other differential considerations include accelerated/hereditary small vessel ischemia, sequelae of hypercoagulable state, vasculitis, prior infection or less likely demyelination. 3. Superimposed partially empty sella appearance, along with an effaced appearance of the transverse/sigmoid sinus junctions and capacious optic nerve root sleeves. Consider also idiopathic intracranial hypertension (pseudotumor cerebri). Electronically Signed   By: HGenevie AnnM.D.   On: 02/06/2019 18:53   Mr ORosealee AlbeeWOFContrast  Result Date: 02/06/2019 CLINICAL DATA:  48year old female with 2 months of blurred vision. Referred by ophthalmology, possible neurosarcoidosis. EXAM: MRI HEAD AND ORBITS WITHOUT AND WITH CONTRAST TECHNIQUE: Multiplanar, multiecho pulse sequences of the brain and surrounding structures were obtained without and with intravenous contrast. Multiplanar, multiecho pulse sequences of the orbits and surrounding structures were obtained including fat saturation techniques, before and after intravenous contrast administration. CONTRAST:  733mGADAVIST GADOBUTROL 1 MMOL/ML IV SOLN COMPARISON:  Report of 09/29/1999 sinus CT (no images available). FINDINGS: MRI HEAD FINDINGS Brain: Partially empty sella. No convincing restricted diffusion. No midline shift, mass effect, or evidence of intracranial mass lesion. Prominent ventricle size for age, although the temporal horns remain diminutive. No chronic cerebral blood products identified, and no definite cortical encephalomalacia. However there is widespread abnormal cerebral white matter T2 and FLAIR hyperintensity, with  similar signal heterogeneity in the  bilateral deep gray nuclei. Mild patchy T2 hyperintensity in the pons more so on the left. No cortical edema/gyral swelling identified. No abnormal enhancement identified.  No dural thickening identified. Vascular: Major intracranial vascular flow voids are preserved; the distal left vertebral artery appears dominant and the right diminutive or absent. Major dural venous sinuses are enhancing and appear to be patent. Skull and upper cervical spine: Negative visible cervical spine aside from degenerative disc bulging. Mildly heterogeneous marrow signal is within normal limits. Degenerative synovial type cyst of the left TMJ incidentally noted (7 millimeters series 24, image 3). Other: Visible internal auditory structures appear normal. Mastoids are clear. MRI ORBITS FINDINGS Orbits: No suprasellar mass effect. The optic chiasm appears normal. The optic nerves appear symmetric and without abnormal T2 hyperintensity or enhancement of the substance of the nerves. There is bilateral optic nerve root sleeve CSF. No intraorbital inflammation or mass. Lacrimal glands and extraocular muscles appear symmetric and within normal limits. Normal cavernous sinus. Globes appear symmetric and normal aside from mildly disconjugate gaze. Soft tissues: Superficial and deep soft tissue spaces of the face appear negative. IMPRESSION: 1. Essentially negative MRI appearance of the orbits (mildly disconjugate gaze, and CSF in both optic nerve root sleeves, see #3). 2. Highly advanced for age but nonspecific signal abnormality scattered in the bilateral cerebral white matter and deep gray nuclei. Comparatively minor involvement of the brainstem. No associated enhancement or dural thickening, arguing against neurosarcoidosis. Although CSF analysis for Ace level may be valuable. Other differential considerations include accelerated/hereditary small vessel ischemia, sequelae of hypercoagulable state, vasculitis, prior infection or less likely  demyelination. 3. Superimposed partially empty sella appearance, along with an effaced appearance of the transverse/sigmoid sinus junctions and capacious optic nerve root sleeves. Consider also idiopathic intracranial hypertension (pseudotumor cerebri). Electronically Signed   By: Genevie Ann M.D.   On: 02/06/2019 18:53    Patriciaann Clan, DO 02/06/2019, 11:29 PM PGY-2, [facility] pager: [phone], text pages welcome

## 2019-02-06 NOTE — ED Notes (Signed)
Called MRI to let them know pt has IV and labs resulted. Pt is next in line for MRI.

## 2019-02-06 NOTE — ED Notes (Signed)
Pt remains in CT

## 2019-02-06 NOTE — ED Notes (Signed)
Per PA pt may have 125 mg of pre packaged methodone. Witnessed and documented at this time.

## 2019-02-06 NOTE — ED Triage Notes (Signed)
Patient here as requested by optometrist for further evaluation of optic nerve swelling. States that she has had blurred vision x 2 months, NAD

## 2019-02-06 NOTE — ED Notes (Signed)
Patient transported to CT 

## 2019-02-06 NOTE — ED Notes (Signed)
Contacted Annette Jennings who will reach out to IR.

## 2019-02-07 ENCOUNTER — Observation Stay (HOSPITAL_COMMUNITY): Payer: Medicaid Other

## 2019-02-07 DIAGNOSIS — H543 Unqualified visual loss, both eyes: Secondary | ICD-10-CM

## 2019-02-07 DIAGNOSIS — I10 Essential (primary) hypertension: Secondary | ICD-10-CM

## 2019-02-07 DIAGNOSIS — F1120 Opioid dependence, uncomplicated: Secondary | ICD-10-CM

## 2019-02-07 LAB — COMPREHENSIVE METABOLIC PANEL
ALT: 36 U/L (ref 0–44)
AST: 40 U/L (ref 15–41)
Albumin: 3.6 g/dL (ref 3.5–5.0)
Alkaline Phosphatase: 170 U/L — ABNORMAL HIGH (ref 38–126)
Anion gap: 10 (ref 5–15)
BUN: 8 mg/dL (ref 6–20)
CO2: 26 mmol/L (ref 22–32)
Calcium: 9 mg/dL (ref 8.9–10.3)
Chloride: 100 mmol/L (ref 98–111)
Creatinine, Ser: 0.71 mg/dL (ref 0.44–1.00)
GFR calc Af Amer: >60 mL/min (ref >60)
GFR calc non Af Amer: >60 mL/min (ref >60)
Glucose, Bld: 101 mg/dL — ABNORMAL HIGH (ref 70–99)
Potassium: 3.2 mmol/L — ABNORMAL LOW (ref 3.5–5.1)
Sodium: 136 mmol/L (ref 135–145)
Total Bilirubin: 0.7 mg/dL (ref 0.3–1.2)
Total Protein: 7.8 g/dL (ref 6.5–8.1)

## 2019-02-07 LAB — GLUCOSE, CSF: Glucose, CSF: 69 mg/dL (ref 40–70)

## 2019-02-07 LAB — RPR: RPR Ser Ql: NONREACTIVE

## 2019-02-07 LAB — CSF CELL COUNT WITH DIFFERENTIAL
RBC Count, CSF: 1170 /mm3 — ABNORMAL HIGH
RBC Count, CSF: 268 /mm3 — ABNORMAL HIGH
Tube #: 1
Tube #: 4
WBC, CSF: 0 /mm3 (ref 0–5)
WBC, CSF: 5 /mm3 (ref 0–5)

## 2019-02-07 LAB — HIV ANTIBODY (ROUTINE TESTING W REFLEX): HIV Screen 4th Generation wRfx: NONREACTIVE

## 2019-02-07 LAB — GAMMA GT: GGT: 55 U/L — ABNORMAL HIGH (ref 7–50)

## 2019-02-07 LAB — PROTEIN, CSF: Total  Protein, CSF: 23 mg/dL (ref 15–45)

## 2019-02-07 LAB — SARS CORONAVIRUS 2 (TAT 6-24 HRS): SARS Coronavirus 2: NEGATIVE

## 2019-02-07 MED ORDER — METHADONE HCL 10 MG PO TABS
120.0000 mg | ORAL_TABLET | Freq: Every day | ORAL | Status: DC
  Administered 2019-02-07 – 2019-02-11 (×5): 120 mg via ORAL
  Filled 2019-02-07 (×5): qty 12

## 2019-02-07 MED ORDER — POTASSIUM CHLORIDE CRYS ER 20 MEQ PO TBCR
40.0000 meq | EXTENDED_RELEASE_TABLET | Freq: Once | ORAL | Status: AC
  Administered 2019-02-07: 40 meq via ORAL
  Filled 2019-02-07: qty 2

## 2019-02-07 MED ORDER — IOHEXOL 350 MG/ML SOLN
75.0000 mL | Freq: Once | INTRAVENOUS | Status: AC | PRN
  Administered 2019-02-07: 75 mL via INTRAVENOUS

## 2019-02-07 MED ORDER — SODIUM CHLORIDE 0.9 % IV SOLN
1000.0000 mg | Freq: Every day | INTRAVENOUS | Status: AC
  Administered 2019-02-07 – 2019-02-11 (×4): 1000 mg via INTRAVENOUS
  Filled 2019-02-07 (×6): qty 8

## 2019-02-07 MED ORDER — LIDOCAINE HCL (PF) 1 % IJ SOLN
INTRAMUSCULAR | Status: AC
  Administered 2019-02-07: 100 mL via INTRATHECAL
  Filled 2019-02-07: qty 5

## 2019-02-07 MED ORDER — METHADONE HCL 40 MG PO TBSO: 120.0000 mg | ORAL_TABLET | Freq: Every day | ORAL | Status: DC

## 2019-02-07 NOTE — Evaluation (Signed)
Occupational Therapy Evaluation Patient Details Name: Annette Jennings MRN: [id number] DOB: 1970/11/24 Today's Date: 02/07/2019    History of Present Illness This 48 y.o. female admitted with progressively worsening bil. vision loss > unclear etiology thus far and work up underway.  PMH includes HTN, previous h/o substance abuse, chronic mentadone use   Clinical Impression   Pt admitted with above. She demonstrates the below listed deficits and will benefit from continued OT to maximize safety and independence with BADLs.  Brief eval completed.  Activity limited due to pt had undergone LP ~ 1 hour prior.  Pt demonstrates bil. Lt and Rt peripheral field deficit with central sparring.  She eludes to having possible diplopia with central vision, but becomes defensive and elusive when OT asks further questions for clarification.  She is able to use call bell, phone, and operate bed controls mod I with looking at them in central field, and demonstrates no sign of under shooting or over shooting.  She reports a progressive decline in vision over the course of several weeks, and indicates she has a friend who has been providing assist with transportation and groceries.  She is elusive when OT attempted to gain further insight into how much assist she truly has at discharge.  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  Will follow acutely        Follow Up Recommendations  Home health OT vs. Outpatient OT;Supervision - Intermittent    Equipment Recommendations       Recommendations for Other Services       Precautions / Restrictions Precautions Precautions: Fall Precaution Comments: due to vision loss       Mobility Bed Mobility Overal bed mobility: Independent             General bed mobility comments: Per  pt and nsg report, pt is able to perform independently   Transfers Overall transfer level: Needs assistance               General transfer comment: per RN and pt report, she requires supervision due to visual deficits.  unable to fully assess as pt had LP done ~ 1hour PTA     Balance                                           ADL either performed or assessed with clinical judgement   ADL                                         General ADL Comments: Pt, and RN confirms, has been able to ambulate to BR and assist wtih ADLs at supervision level.      Vision Baseline Vision/History: (gradualy worsening of vision bil. ) Patient Visual Report: Blurring of vision;Peripheral vision impairment Additional Comments: EOMs full. She does not under or over shoot when reaching for objects.  Pt demonstrates impaired fields bil. eyes with central sparing.  She is able to read cloc in room. she is able to operate bed controls, and call bell.  She reports what sounds like shadowing of objects or diplopia -pt describes it  as if she were looking at objects cross eyed. When clarification sought about her seeing diplopia, she became very irritated and repeats its like looking at something cross eyed      Perception Perception Perception Tested?: Yes   Maddock tested?: Within functional limits    Pertinent Vitals/Pain Pain Assessment: No/denies pain     Hand Dominance Right   Extremity/Trunk Assessment Upper Extremity Assessment Upper Extremity Assessment: Overall WFL for tasks assessed   Lower Extremity Assessment Lower Extremity Assessment: Overall WFL for tasks assessed   Cervical / Trunk Assessment Cervical / Trunk Assessment: Normal   Communication Communication Communication: No difficulties   Cognition Arousal/Alertness: Awake/alert Behavior During Therapy: WFL for tasks assessed/performed Overall Cognitive Status: Within  Functional Limits for tasks assessed                                 General Comments: grossly [facility]    General Comments       Exercises     Shoulder Instructions      Home Living Family/patient expects to be discharged to:: Private residence   Available Help at Discharge: Friend(s);Available PRN/intermittently Type of Home: Apartment Home Access: Elevator     Home Layout: One level     Bathroom Shower/Tub: Teacher, early years/pre: Standard         Additional Comments: Pt reports she lives in an apartment, however, chart review indicates she lives in an extended stay hotel       Prior Functioning/Environment Level of Independence: Independent        Comments: Pt reports she has been independent, but has had worsening visual deficits for the past 3 weeks.  She reports a friend assists her with groceries and transportation, and friend is around a lot and can assist as needed.  She, however, is guarded and evasive with answers         OT Problem List: Decreased activity tolerance;Decreased knowledge of use of DME or AE;Impaired vision/perception      OT Treatment/Interventions: Self-care/ADL training;Therapeutic activities;Visual/perceptual remediation/compensation;Patient/family education;DME and/or AE instruction    OT Goals(Current goals can be found in the care plan section) Acute Rehab OT Goals Patient Stated Goal: to see better  OT Goal Formulation: With patient Time For Goal Achievement: 02/21/19 Potential to Achieve Goals: Good ADL Goals Pt Will Perform Grooming: with modified independence;standing Pt Will Perform Upper Body Bathing: with modified independence;standing Pt Will Perform Lower Body Bathing: with modified independence;sit to/from stand Pt Will Perform Upper Body Dressing: with modified independence;sitting Pt Will Perform Lower Body Dressing: with modified independence;sit to/from stand Pt Will Perform Toileting -  Clothing Manipulation and hygiene: with modified independence;sit to/from stand Pt Will Perform Tub/Shower Transfer: Tub transfer;with modified independence;ambulating Additional ADL Goal #1: Pt will independently incorporate compensatory scan strategies into daily tasks Additional ADL Goal #2: Pt will perform path finding tasks with supervision and min cues to scan busy, complex environment  OT Frequency: Min 2X/week   Barriers to D/C: Decreased caregiver support  unsure level of support pt has available at discharge        Co-evaluation              AM-PAC OT "6 Clicks" Daily Activity     Outcome Measure Help from another person eating meals?: None Help from another person taking care of personal grooming?: A Little Help from another person toileting, which includes using toliet,  bedpan, or urinal?: A Little Help from another person bathing (including washing, rinsing, drying)?: A Little Help from another person to put on and taking off regular upper body clothing?: A Little Help from another person to put on and taking off regular lower body clothing?: A Little 6 Click Score: 19   End of Session Nurse Communication: Mobility status  Activity Tolerance: Other (comment)(limited activity ) Patient left: in bed;with call bell/phone within reach  OT Visit Diagnosis: Low vision, both eyes (H54.2)                Time: [id number] OT Time Calculation (min): 8 min Charges:  OT General Charges $OT Visit: 1 Visit OT Evaluation $OT Eval Moderate Complexity: 1 Mod  Lucille Passy, OTR/L Acute Rehabilitation Services Pager [phone] Office [phone]   Lucille Passy M 02/07/2019, 6:01 PM

## 2019-02-07 NOTE — Progress Notes (Signed)
Family Medicine Teaching Service Daily Progress Note Intern Pager: [phone]  Patient name: Annette Jennings Medical record number: [id number] Date of birth: 04/27/1971 Age: 48 y.o. Gender: female  Primary Care Provider: Patient, No Pcp Per Consultants: IR, neurology Code Status: Full  Pt Overview and Major Events to Date:  Admitted 9/19  Assessment and Plan: Annette Jennings is a 48 y.o. female presenting with gradual onset, progressively worsening bilateral vision loss. PMH is significant for elevated blood pressure, previous substance abuse, chronic methadone use.   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 Unclear etiology thus far, CTA head without any evidence of CNS vasculitis or vessel occlusion/stenosis.  Awaiting LP.  Pt significantly distressed by symptoms, does not feel comfortable going home on her own. -Neurology on board, appreciate recommendations -Follow-up LP CSF studies -Follow-up HIV, RPR per neurology -Monitor visual changes - OT eval  Elevated blood pressure: Improving. SBP 132/78 this a.m.  Intermittently ranging from 235T-732K systolic overnight.  We will continue to monitor for now, recommend establishing follow-up with primary care provider for hypertensive therapy. - Monitor BP - If remains elevated, will consider starting controlling therapy - Case management consult for establishing PCP  Previous substance abuse, methadone use: Stable. 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  Daily methadone use through [facility] in [place]. - Dosage verified with clinic through pharmacy team on 9/19 - Methadone 120 mg nightly  Left adrenal mass on CT: Incidental finding of a 2.6 x 2.1 cm left adrenal mass.  - Recommend CT/MRI outpatient for further evaluation   Elevated alkaline phosphatase: Stable. Alk phos 170 today, from 187. Bilirubin, AST/ALT wnl. -GGT -Consider liver U/S pending GGT  Tobacco use: Stable. Smokes  1-2 cigarettes daily.  Will hold on nicotine patch for now.  FEN/GI: Regular diet Prophylaxis: Lovenox  Disposition:  Potentially discharge home today or tomorrow pending further work-up  Subjective:  Admitted overnight.  Doing well, symptoms/visual loss unchanged.  Very stressed as to why we do not have an answer for what is going on with her eyes.  She does not want to go home on her own, concerned about doing her daily tasks by herself.  Has family in town, however they would not be able to help her with things that she would need.   Objective: Temp:  [98.5 F (36.9 C)-98.6 F (37 C)] 98.6 F (37 C) (09/20 0119) Pulse Rate:  [75-89] 75 (09/20 0119) Resp:  [16-20] 18 (09/20 0119) BP: (128-179)/(78-115) 168/88 (09/20 0119) SpO2:  [89 %-100 %] 96 % (09/20 0056) Physical Exam: General: Alert, NAD HEENT: NCAT, MMM, EOMI, PERRLA.  Bilateral visual loss within superior/inferior/peripheral fields to about 45 degrees bilaterally. Lungs: Clear bilaterally, no increased WOB  Abdomen: soft, non-tender, non-distended, normoactive BS Neuro: Alert and oriented, no focal neuro deficits on exam Msk: Moves all extremities spontaneously  Ext: Warm, dry, 2+ dorsalis pedis pulses   Laboratory: Recent Labs  Lab 02/06/19 1327  WBC 8.9  HGB 13.3  HCT 40.4  PLT 290   Recent Labs  Lab 02/06/19 1327  NA 136  K 3.5  CL 100  CO2 26  BUN 10  CREATININE 0.79  CALCIUM 9.3  PROT 8.5*  BILITOT 0.9  ALKPHOS 187*  ALT 40  AST 45*  GLUCOSE 105*    Imaging/Diagnostic Tests: Ct Chest W Contrast  Result Date: 02/06/2019 CLINICAL DATA:  Sarcoid. EXAM: CT CHEST WITH CONTRAST TECHNIQUE: Multidetector CT imaging of the chest was performed during intravenous contrast  administration. CONTRAST:  30m OMNIPAQUE IOHEXOL 300 MG/ML  SOLN COMPARISON:  None. FINDINGS: Cardiovascular: Heart size is normal. There is no significant pericardial effusion. The thoracic aorta is unremarkable. Mediastinum/Nodes:  --No mediastinal or hilar lymphadenopathy. --No axillary lymphadenopathy. --No supraclavicular lymphadenopathy. --Normal thyroid gland. --The esophagus is unremarkable Lungs/Pleura: No pulmonary nodules or masses. No pleural effusion or pneumothorax. No focal airspace consolidation. No focal pleural abnormality. Upper Abdomen: There is hepatic steatosis. There is a 2.6 x 2.1 cm left adrenal mass. Musculoskeletal: No chest wall abnormality. No acute or significant osseous findings. Review of the MIP images confirms the above findings. IMPRESSION: 1. No acute abnormality detected.  No evidence for sarcoidosis. 2. Hepatic steatosis. 3. 2.6 x 2.1 cm left adrenal mass, incompletely characterized on this single phase study. Further evaluation with a nonemergent outpatient adrenal protocol CT or MRI is recommended. Electronically Signed   By: CConstance HolsterM.D.   On: 02/06/2019 22:06   Mr BJeri CosAnd Wo Contrast  Result Date: 02/06/2019 CLINICAL DATA:  48year old female with 2 months of blurred vision. Referred by ophthalmology, possible neurosarcoidosis. EXAM: MRI HEAD AND ORBITS WITHOUT AND WITH CONTRAST TECHNIQUE: Multiplanar, multiecho pulse sequences of the brain and surrounding structures were obtained without and with intravenous contrast. Multiplanar, multiecho pulse sequences of the orbits and surrounding structures were obtained including fat saturation techniques, before and after intravenous contrast administration. CONTRAST:  723mGADAVIST GADOBUTROL 1 MMOL/ML IV SOLN COMPARISON:  Report of 09/29/1999 sinus CT (no images available). FINDINGS: MRI HEAD FINDINGS Brain: Partially empty sella. No convincing restricted diffusion. No midline shift, mass effect, or evidence of intracranial mass lesion. Prominent ventricle size for age, although the temporal horns remain diminutive. No chronic cerebral blood products identified, and no definite cortical encephalomalacia. However there is widespread abnormal  cerebral white matter T2 and FLAIR hyperintensity, with similar signal heterogeneity in the bilateral deep gray nuclei. Mild patchy T2 hyperintensity in the pons more so on the left. No cortical edema/gyral swelling identified. No abnormal enhancement identified.  No dural thickening identified. Vascular: Major intracranial vascular flow voids are preserved; the distal left vertebral artery appears dominant and the right diminutive or absent. Major dural venous sinuses are enhancing and appear to be patent. Skull and upper cervical spine: Negative visible cervical spine aside from degenerative disc bulging. Mildly heterogeneous marrow signal is within normal limits. Degenerative synovial type cyst of the left TMJ incidentally noted (7 millimeters series 24, image 3). Other: Visible internal auditory structures appear normal. Mastoids are clear. MRI ORBITS FINDINGS Orbits: No suprasellar mass effect. The optic chiasm appears normal. The optic nerves appear symmetric and without abnormal T2 hyperintensity or enhancement of the substance of the nerves. There is bilateral optic nerve root sleeve CSF. No intraorbital inflammation or mass. Lacrimal glands and extraocular muscles appear symmetric and within normal limits. Normal cavernous sinus. Globes appear symmetric and normal aside from mildly disconjugate gaze. Soft tissues: Superficial and deep soft tissue spaces of the face appear negative. IMPRESSION: 1. Essentially negative MRI appearance of the orbits (mildly disconjugate gaze, and CSF in both optic nerve root sleeves, see #3). 2. Highly advanced for age but nonspecific signal abnormality scattered in the bilateral cerebral white matter and deep gray nuclei. Comparatively minor involvement of the brainstem. No associated enhancement or dural thickening, arguing against neurosarcoidosis. Although CSF analysis for Ace level may be valuable. Other differential considerations include accelerated/hereditary small  vessel ischemia, sequelae of hypercoagulable state, vasculitis, prior infection or less likely demyelination. 3. Superimposed  partially empty sella appearance, along with an effaced appearance of the transverse/sigmoid sinus junctions and capacious optic nerve root sleeves. Consider also idiopathic intracranial hypertension (pseudotumor cerebri). Electronically Signed   By: Genevie Ann M.D.   On: 02/06/2019 18:53   Mr Rosealee Albee QF Contrast  Result Date: 02/06/2019 CLINICAL DATA:  47 year old female with 2 months of blurred vision. Referred by ophthalmology, possible neurosarcoidosis. EXAM: MRI HEAD AND ORBITS WITHOUT AND WITH CONTRAST TECHNIQUE: Multiplanar, multiecho pulse sequences of the brain and surrounding structures were obtained without and with intravenous contrast. Multiplanar, multiecho pulse sequences of the orbits and surrounding structures were obtained including fat saturation techniques, before and after intravenous contrast administration. CONTRAST:  55m GADAVIST GADOBUTROL 1 MMOL/ML IV SOLN COMPARISON:  Report of 09/29/1999 sinus CT (no images available). FINDINGS: MRI HEAD FINDINGS Brain: Partially empty sella. No convincing restricted diffusion. No midline shift, mass effect, or evidence of intracranial mass lesion. Prominent ventricle size for age, although the temporal horns remain diminutive. No chronic cerebral blood products identified, and no definite cortical encephalomalacia. However there is widespread abnormal cerebral white matter T2 and FLAIR hyperintensity, with similar signal heterogeneity in the bilateral deep gray nuclei. Mild patchy T2 hyperintensity in the pons more so on the left. No cortical edema/gyral swelling identified. No abnormal enhancement identified.  No dural thickening identified. Vascular: Major intracranial vascular flow voids are preserved; the distal left vertebral artery appears dominant and the right diminutive or absent. Major dural venous sinuses are  enhancing and appear to be patent. Skull and upper cervical spine: Negative visible cervical spine aside from degenerative disc bulging. Mildly heterogeneous marrow signal is within normal limits. Degenerative synovial type cyst of the left TMJ incidentally noted (7 millimeters series 24, image 3). Other: Visible internal auditory structures appear normal. Mastoids are clear. MRI ORBITS FINDINGS Orbits: No suprasellar mass effect. The optic chiasm appears normal. The optic nerves appear symmetric and without abnormal T2 hyperintensity or enhancement of the substance of the nerves. There is bilateral optic nerve root sleeve CSF. No intraorbital inflammation or mass. Lacrimal glands and extraocular muscles appear symmetric and within normal limits. Normal cavernous sinus. Globes appear symmetric and normal aside from mildly disconjugate gaze. Soft tissues: Superficial and deep soft tissue spaces of the face appear negative. IMPRESSION: 1. Essentially negative MRI appearance of the orbits (mildly disconjugate gaze, and CSF in both optic nerve root sleeves, see #3). 2. Highly advanced for age but nonspecific signal abnormality scattered in the bilateral cerebral white matter and deep gray nuclei. Comparatively minor involvement of the brainstem. No associated enhancement or dural thickening, arguing against neurosarcoidosis. Although CSF analysis for Ace level may be valuable. Other differential considerations include accelerated/hereditary small vessel ischemia, sequelae of hypercoagulable state, vasculitis, prior infection or less likely demyelination. 3. Superimposed partially empty sella appearance, along with an effaced appearance of the transverse/sigmoid sinus junctions and capacious optic nerve root sleeves. Consider also idiopathic intracranial hypertension (pseudotumor cerebri). Electronically Signed   By: HGenevie AnnM.D.   On: 02/06/2019 18:53    BPatriciaann Clan DO 02/07/2019, 1:22 AM PGY-2, [place]ay VillageIntern pager: [phone] text pages welcome

## 2019-02-07 NOTE — Progress Notes (Addendum)
IR.SWNI the neurologist                     NEURO HOSPITALIST PROGRESS NOTE   Subjective: Patient awake, alert, in bed, NAD, c/o back pain from LP attempt last night. Peripheral vision is affected.   Exam: Vitals:   02/07/19 0119 02/07/19 0358  BP: (!) 168/88 132/78  Pulse: 75 79  Resp: 18 15  Temp: 98.6 F (37 C) 98.1 F (36.7 C)  SpO2:      Physical Exam   HEENT-  Normocephalic, no lesions, without obvious abnormality.  Normal external eye and conjunctiva.   Cardiovascular- S1-S2 audible, pulses palpable throughout   Lungs-no rhonchi or wheezing noted, no excessive working breathing.  Saturations within normal limits on RA Extremities- Warm, dry and intact Musculoskeletal-no joint tenderness, deformity or swelling Skin-warm and dry, no hyperpigmentation, vitiligo, or suspicious lesions   Neuro:  Mental Status: Alert, oriented, thought content appropriate.  Speech fluent without evidence of aphasia.  Able to follow commands without difficulty. Cranial Nerves: II:  Peripheral vision is decreased.   III,IV, VI: ptosis not present,  Right eye exotropia, extra-ocular motions intact bilaterally pupils equal, round, reactive to light and accommodation V,VII: smile symmetric, facial light touch sensation normal bilaterally VIII: hearing normal bilaterally IX,X: uvula rises symmetrically XI: bilateral shoulder shrug XII: midline tongue extension Motor: Right : Upper extremity   5/5  Left:     Upper extremity   5/5  Lower extremity   5/5   Lower extremity   5/5 Tone and bulk:normal tone throughout; no atrophy noted Sensory:  light touch intact throughout, bilaterally Deep Tendon Reflexes: 2+ and symmetric biceps, patella Cerebellar: normal finger-to-nose, Gait: deferred    Medications:  Scheduled: . enoxaparin (LOVENOX) injection  40 mg Subcutaneous Q24H  . methadone  120 mg Oral QHS  . potassium chloride  40 mEq Oral Once   Continuous:  OEV:[id number] **OR**  acetaminophen, polyethylene glycol  Pertinent Labs/Diagnostics:   Ct Angio Head W Or Wo Contrast  Result Date: 02/07/2019 CLINICAL DATA:  Concern for vasculitis.  Blurry vision for 2 months. EXAM: CT ANGIOGRAPHY HEAD TECHNIQUE: Multidetector CT imaging of the head was performed using the standard protocol during bolus administration of intravenous contrast. Multiplanar CT image reconstructions and MIPs were obtained to evaluate the vascular anatomy. CONTRAST:  80m OMNIPAQUE IOHEXOL 350 MG/ML SOLN COMPARISON:  Brain MRI 02/06/2019 FINDINGS: POSTERIOR CIRCULATION: --Vertebral arteries: Diminutive right vertebral artery terminates in PICA. Left V4 segment is normal. --Posterior inferior cerebellar arteries (PICA): Normal --Anterior inferior cerebellar arteries (AICA): Patent origins from the basilar artery. --Basilar artery: Normal. --Superior cerebellar arteries: Normal. --Posterior cerebral arteries: Normal. Both originate from the basilar artery. Posterior communicating arteries (p-comm) are diminutive or absent. ANTERIOR CIRCULATION: --Intracranial internal carotid arteries: Normal. --Anterior cerebral arteries (ACA): Normal. Both A1 segments are present. Patent anterior communicating artery (a-comm). --Middle cerebral arteries (MCA): Normal. Trace amount of air in the right lateral ventricle frontal horn. This is an incidental finding that may be seen following lumbar puncture. IMPRESSION: 1. No evidence for CNS vasculitis. 2. No emergent large vessel occlusion or flow-limiting stenosis. Electronically Signed   By: KUlyses JarredM.D.   On: 02/07/2019 03:08   Ct Chest W Contrast  Result Date: 02/06/2019 CLINICAL DATA:  Sarcoid. EXAM: CT CHEST WITH CONTRAST TECHNIQUE: Multidetector CT imaging of the chest was performed during intravenous contrast administration. CONTRAST:  771mOMNIPAQUE IOHEXOL 300 MG/ML  SOLN COMPARISON:  None. FINDINGS: Cardiovascular: Heart size is  normal. There is no significant  pericardial effusion. The thoracic aorta is unremarkable. Mediastinum/Nodes: --No mediastinal or hilar lymphadenopathy. --No axillary lymphadenopathy. --No supraclavicular lymphadenopathy. --Normal thyroid gland. --The esophagus is unremarkable Lungs/Pleura: No pulmonary nodules or masses. No pleural effusion or pneumothorax. No focal airspace consolidation. No focal pleural abnormality. Upper Abdomen: There is hepatic steatosis. There is a 2.6 x 2.1 cm left adrenal mass. Musculoskeletal: No chest wall abnormality. No acute or significant osseous findings. Review of the MIP images confirms the above findings. IMPRESSION: 1. No acute abnormality detected.  No evidence for sarcoidosis. 2. Hepatic steatosis. 3. 2.6 x 2.1 cm left adrenal mass, incompletely characterized on this single phase study. Further evaluation with a nonemergent outpatient adrenal protocol CT or MRI is recommended. Electronically Signed   By: Constance Holster M.D.   On: 02/06/2019 22:06   Mr Jeri Cos And Wo Contrast  Result Date: 02/06/2019 CLINICAL DATA:  48 year old female with 2 months of blurred vision. Referred by ophthalmology, possible neurosarcoidosis. EXAM: MRI HEAD AND ORBITS WITHOUT AND WITH CONTRAST TECHNIQUE: Multiplanar, multiecho pulse sequences of the brain and surrounding structures were obtained without and with intravenous contrast. Multiplanar, multiecho pulse sequences of the orbits and surrounding structures were obtained including fat saturation techniques, before and after intravenous contrast administration. CONTRAST:  66m GADAVIST GADOBUTROL 1 MMOL/ML IV SOLN COMPARISON:  Report of 09/29/1999 sinus CT (no images available). FINDINGS: MRI HEAD FINDINGS Brain: Partially empty sella. No convincing restricted diffusion. No midline shift, mass effect, or evidence of intracranial mass lesion. Prominent ventricle size for age, although the temporal horns remain diminutive. No chronic cerebral blood products identified, and  no definite cortical encephalomalacia. However there is widespread abnormal cerebral white matter T2 and FLAIR hyperintensity, with similar signal heterogeneity in the bilateral deep gray nuclei. Mild patchy T2 hyperintensity in the pons more so on the left. No cortical edema/gyral swelling identified. No abnormal enhancement identified.  No dural thickening identified. Vascular: Major intracranial vascular flow voids are preserved; the distal left vertebral artery appears dominant and the right diminutive or absent. Major dural venous sinuses are enhancing and appear to be patent. Skull and upper cervical spine: Negative visible cervical spine aside from degenerative disc bulging. Mildly heterogeneous marrow signal is within normal limits. Degenerative synovial type cyst of the left TMJ incidentally noted (7 millimeters series 24, image 3). Other: Visible internal auditory structures appear normal. Mastoids are clear. MRI ORBITS FINDINGS Orbits: No suprasellar mass effect. The optic chiasm appears normal. The optic nerves appear symmetric and without abnormal T2 hyperintensity or enhancement of the substance of the nerves. There is bilateral optic nerve root sleeve CSF. No intraorbital inflammation or mass. Lacrimal glands and extraocular muscles appear symmetric and within normal limits. Normal cavernous sinus. Globes appear symmetric and normal aside from mildly disconjugate gaze. Soft tissues: Superficial and deep soft tissue spaces of the face appear negative. IMPRESSION: 1. Essentially negative MRI appearance of the orbits (mildly disconjugate gaze, and CSF in both optic nerve root sleeves, see #3). 2. Highly advanced for age but nonspecific signal abnormality scattered in the bilateral cerebral white matter and deep gray nuclei. Comparatively minor involvement of the brainstem. No associated enhancement or dural thickening, arguing against neurosarcoidosis. Although CSF analysis for Ace level may be valuable.  Other differential considerations include accelerated/hereditary small vessel ischemia, sequelae of hypercoagulable state, vasculitis, prior infection or less likely demyelination. 3. Superimposed partially empty sella appearance, along with an effaced appearance of the transverse/sigmoid sinus junctions and capacious optic nerve  root sleeves. Consider also idiopathic intracranial hypertension (pseudotumor cerebri). Electronically Signed   By: Genevie Ann M.D.   On: 02/06/2019 18:53   Mr Rosealee Albee HQ Contrast  Result Date: 02/06/2019 CLINICAL DATA:  48 year old female with 2 months of blurred vision. Referred by ophthalmology, possible neurosarcoidosis. EXAM: MRI HEAD AND ORBITS WITHOUT AND WITH CONTRAST TECHNIQUE: Multiplanar, multiecho pulse sequences of the brain and surrounding structures were obtained without and with intravenous contrast. Multiplanar, multiecho pulse sequences of the orbits and surrounding structures were obtained including fat saturation techniques, before and after intravenous contrast administration. CONTRAST:  84m GADAVIST GADOBUTROL 1 MMOL/ML IV SOLN COMPARISON:  Report of 09/29/1999 sinus CT (no images available). FINDINGS: MRI HEAD FINDINGS Brain: Partially empty sella. No convincing restricted diffusion. No midline shift, mass effect, or evidence of intracranial mass lesion. Prominent ventricle size for age, although the temporal horns remain diminutive. No chronic cerebral blood products identified, and no definite cortical encephalomalacia. However there is widespread abnormal cerebral white matter T2 and FLAIR hyperintensity, with similar signal heterogeneity in the bilateral deep gray nuclei. Mild patchy T2 hyperintensity in the pons more so on the left. No cortical edema/gyral swelling identified. No abnormal enhancement identified.  No dural thickening identified. Vascular: Major intracranial vascular flow voids are preserved; the distal left vertebral artery appears dominant and  the right diminutive or absent. Major dural venous sinuses are enhancing and appear to be patent. Skull and upper cervical spine: Negative visible cervical spine aside from degenerative disc bulging. Mildly heterogeneous marrow signal is within normal limits. Degenerative synovial type cyst of the left TMJ incidentally noted (7 millimeters series 24, image 3). Other: Visible internal auditory structures appear normal. Mastoids are clear. MRI ORBITS FINDINGS Orbits: No suprasellar mass effect. The optic chiasm appears normal. The optic nerves appear symmetric and without abnormal T2 hyperintensity or enhancement of the substance of the nerves. There is bilateral optic nerve root sleeve CSF. No intraorbital inflammation or mass. Lacrimal glands and extraocular muscles appear symmetric and within normal limits. Normal cavernous sinus. Globes appear symmetric and normal aside from mildly disconjugate gaze. Soft tissues: Superficial and deep soft tissue spaces of the face appear negative. IMPRESSION: 1. Essentially negative MRI appearance of the orbits (mildly disconjugate gaze, and CSF in both optic nerve root sleeves, see #3). 2. Highly advanced for age but nonspecific signal abnormality scattered in the bilateral cerebral white matter and deep gray nuclei. Comparatively minor involvement of the brainstem. No associated enhancement or dural thickening, arguing against neurosarcoidosis. Although CSF analysis for Ace level may be valuable. Other differential considerations include accelerated/hereditary small vessel ischemia, sequelae of hypercoagulable state, vasculitis, prior infection or less likely demyelination. 3. Superimposed partially empty sella appearance, along with an effaced appearance of the transverse/sigmoid sinus junctions and capacious optic nerve root sleeves. Consider also idiopathic intracranial hypertension (pseudotumor cerebri). Electronically Signed   By: HGenevie AnnM.D.   On: 02/06/2019 18:53    Assessment:  48year old female presented with several months of visual decline, that has been much more pronounced over the past couple of weeks. Optic nerve edema. LP today with IR. CTA: no LVO, no evidence of CNS vasculitis.    Recommendations:  -- LP for opening pressure, cells, glucose, CSF ACE, CSF VDRL (By IR) -- await results of Lyme titers, Bartonella titers -- RPR, HIV --neurology to continue to follow  JLaurey Morale MSN, NP-C [facility] [phone] Attending neurologist's note to follow with final recommendations  02/07/2019, 8:27 AM  NEUROHOSPITALIST ADDENDUM Performed a face to face diagnostic evaluation.   I have reviewed the contents of history and physical exam as documented by PA/ARNP/Resident and agree with above documentation.  I have discussed and formulated the above plan as documented. Edits to the note have been made as needed.   48 year old female with past medical history significant for methadone abuse, obesity, chronic hypertension, possible alcohol abuse with elevated LFTs admitted for progressive bilateral concentric vision loss, for started on the right few months ago and now affecting the left side. Denies any headache or orbital pain. Was evaluated by Dr. Manuella Ghazi who saw bilateral papilledema right worse than the left.  Patient was sent to the emergency department to get MRI brain with contrast as well as MRI orbits.  There is no optic nerve enhancement seen.  Instead there was a partially empty sella and findings suspicious for idiopathic intracranial hypertension.  There was no meningeal enhancement or  enhancement of optic nerves  on MRI brain (however this is been going on for several months and we may no longer enhance)  Patient admitted for LP, was performed today.  LP had opening pressure of 19.  CSF suggestive of traumatic tap with elevated RBCs that improves on subsequent tube, WBC 5 not suggestive of infection.  Protein also  normal at 23 and glucose normal at 69.  Findings on CSF not favor underlying CNS infection.  Opening pressure not significantly high to cause such concentric loss of vision and papilledema and below threshold for diagnosis of IIH, although slightly higher than normal opening pressure of 15 Other considerations now include autoimmune disease, CSF ACE pending.  CT chest performed to look  for sarcoidosis was negative. ESR and CRP elevated raising suspicion for giant cell although she is less than 50 and denies headache, joint pain, no TA tenderness.    Impression Bilateral concentric vision loss Serum ACE level positive   Plan Follow-up  done by Dr. Manuella Ghazi ; Ana,dsdna, rnp, smith, anti scl70, ssa, ssb, antichromatin, jo-1,centromere b, bartonella, cmv were sent and are pending Will also order NMO ab, although I think is less likely to be the case Consider empiric steroids ( 1g  Solumedrol x 3 days ) for possible underlying inflammatory condition and autoimmune condition after discussion with Dr. Manuella Ghazi.     Karena Addison  MD [facility] [phone]   If 7pm to 7am, please call on call as listed on AMION.

## 2019-02-07 NOTE — Care Management (Signed)
PCP contact information added to AVS. Paient to call to schedule an appointment. CM will follow for additional DC needs.

## 2019-02-07 NOTE — Progress Notes (Signed)
Pt arrived from ED. Alert and oriented. Respirations even and unladored skin warm and dry. Pt reports she has jewelry in [place] but does not want to show staff. Pt reports having tunnel vision.

## 2019-02-08 ENCOUNTER — Encounter (HOSPITAL_COMMUNITY): Payer: Self-pay

## 2019-02-08 DIAGNOSIS — F1120 Opioid dependence, uncomplicated: Secondary | ICD-10-CM

## 2019-02-08 DIAGNOSIS — F1190 Opioid use, unspecified, uncomplicated: Secondary | ICD-10-CM

## 2019-02-08 LAB — COMPREHENSIVE METABOLIC PANEL
ALT: 38 U/L (ref 0–44)
AST: 37 U/L (ref 15–41)
Albumin: 3.8 g/dL (ref 3.5–5.0)
Alkaline Phosphatase: 171 U/L — ABNORMAL HIGH (ref 38–126)
Anion gap: 9 (ref 5–15)
BUN: 7 mg/dL (ref 6–20)
CO2: 26 mmol/L (ref 22–32)
Calcium: 9.5 mg/dL (ref 8.9–10.3)
Chloride: 101 mmol/L (ref 98–111)
Creatinine, Ser: 0.75 mg/dL (ref 0.44–1.00)
GFR calc Af Amer: >60 mL/min (ref >60)
GFR calc non Af Amer: >60 mL/min (ref >60)
Glucose, Bld: 177 mg/dL — ABNORMAL HIGH (ref 70–99)
Potassium: 3.8 mmol/L (ref 3.5–5.1)
Sodium: 136 mmol/L (ref 135–145)
Total Bilirubin: 0.6 mg/dL (ref 0.3–1.2)
Total Protein: 8.6 g/dL — ABNORMAL HIGH (ref 6.5–8.1)

## 2019-02-08 LAB — ANGIOTENSIN CONVERTING ENZYME, CSF: Angio Convert Enzyme: <1.5 U/L (ref 0.0–2.5)

## 2019-02-08 LAB — VDRL, CSF: VDRL Quant, CSF: NONREACTIVE

## 2019-02-08 MED ORDER — PANTOPRAZOLE SODIUM 40 MG PO TBEC
40.0000 mg | DELAYED_RELEASE_TABLET | Freq: Two times a day (BID) | ORAL | Status: DC
  Administered 2019-02-08 – 2019-02-12 (×9): 40 mg via ORAL
  Filled 2019-02-08 (×9): qty 1

## 2019-02-08 NOTE — TOC Initial Note (Signed)
Transition of Care [facility]) - Initial/Assessment Note    Patient Details  Name: Annette Jennings MRN: [id number] Date of Birth: June 22, 1970  [facility]) CM/SW Contact:    Pollie Friar, RN Phone Number: 02/08/2019, 3:18 PM  Clinical Narrative:                 Pt lives alone. She states she wasn't on any medications at home. She denies transportation issues.  Pt would like to get set up with one of the [facility]. CM will get her an appt closer to d/c.  TOC following.  Expected Discharge Plan: OP Rehab Barriers to Discharge: Continued Medical Work up   Patient Goals and CMS Choice     Choice offered to / list presented to : Patient  Expected Discharge Plan and Services Expected Discharge Plan: OP Rehab   Discharge Planning Services: CM Consult                                          Prior Living Arrangements/Services   Lives with:: Self Patient language and need for interpreter reviewed:: Yes(no needs) Do you feel safe going back to the place where you live?: Yes      Need for Family Participation in Patient Care: Yes (Comment)(intermittent supervision) Care giver support system in place?: (pt has brother and friend that can check in on her)   Criminal Activity/Legal Involvement Pertinent to Current Situation/Hospitalization: No - Comment as needed  Activities of Daily Living Home Assistive Devices/Equipment: None ADL Screening (condition at time of admission) Patient's cognitive ability adequate to safely complete daily activities?: Yes Is the patient deaf or have difficulty hearing?: No Does the patient have difficulty seeing, even when wearing glasses/contacts?: Yes Does the patient have difficulty concentrating, remembering, or making decisions?: No Patient able to express need for assistance with ADLs?: Yes Does the patient have difficulty dressing or bathing?: No Independently performs ADLs?: Yes (appropriate for developmental age) Does  the patient have difficulty walking or climbing stairs?: No Weakness of Legs: None Weakness of Arms/Hands: None  Permission Sought/Granted                  Emotional Assessment Appearance:: Appears stated age Attitude/Demeanor/Rapport: Engaged Affect (typically observed): Accepting, Pleasant Orientation: : Oriented to  Time, Oriented to Situation, Oriented to Place, Oriented to Self   Psych Involvement: No (comment)  Admission diagnosis:  Vision loss, bilateral [H54.3] Patient Active Problem List   Diagnosis Date Noted  . Methadone use ([place])   . Vision loss, bilateral 02/06/2019   PCP:  Patient, No Pcp Per Pharmacy:   [id number] - [place] [place] [address] Phone: [phone] Fax: [phone]     Social Determinants of Health (SDOH) Interventions    Readmission Risk Interventions No flowsheet data found.

## 2019-02-08 NOTE — Progress Notes (Addendum)
NEUROLOGY PROGRESS NOTE  Subjective: Patient states that she is having a little bit more peripheral vision on the right.  Still complaining of double vision.  Exam: Vitals:   02/08/19 0809 02/08/19 0835  BP: (!) 170/110 (!) 146/80  Pulse: 90   Resp:    Temp: 98.6 F (37 C)   SpO2: 97%     Physical Exam  HEENT-  Normocephalic, no lesions, without obvious abnormality.  Normal external eye and conjunctiva.   Extremities- Warm, dry and intact Musculoskeletal-no joint tenderness, deformity or swelling Skin-warm and dry, no hyperpigmentation, vitiligo, or suspicious lesions  Neuro:  Mental Status: Alert, oriented, thought content appropriate.  Speech fluent without evidence of aphasia.  Able to follow 3 step commands without difficulty. Cranial Nerves: II:  Visual fields grossly decreased in all peripheral fields III,IV, VI: Patient has a baseline exotropia of the right eye.  Pupils are equal and reactive. EOMs are intact V,VII: smile symmetric at rest; appears to have a slight right facial droop with movement. Facial light touch sensation normal bilaterally VIII: hearing normal bilaterally IX,X: Palate rises equally XI: bilateral shoulder shrug XII: midline tongue extension Motor: Right : Upper extremity   5/5    Left:     Upper extremity   5/5  Lower extremity   5/5     Lower extremity   5/5 Tone and bulk:normal tone throughout; no atrophy noted Sensory: Pinprick and light touch intact throughout, bilaterally Deep Tendon Reflexes: 2+ and symmetric throughout   Medications:  Prior to Admission:  Medications Prior to Admission  Medication Sig Dispense Refill Last Dose  . acetaminophen (TYLENOL) 500 MG tablet Take 1,000 mg by mouth every 6 (six) hours as needed for headache (pain).   week ago  . APPLE CIDER VINEGAR PO Take 1 tablet by mouth daily.   02/05/2019 at am  . METHADONE HCL PO Take 120 mg by mouth daily.   02/05/2019 at am  . Multiple Vitamin (MULTIVITAMIN WITH  MINERALS) TABS tablet Take 1 tablet by mouth daily.   02/05/2019 at am   Scheduled: . enoxaparin (LOVENOX) injection  40 mg Subcutaneous Q24H  . methadone  120 mg Oral QHS   Continuous: . methylPREDNISolone (SOLU-MEDROL) injection 1,000 mg (02/07/19 2251)    Pertinent Labs/Diagnostics: ACE pending  VDRL pending NMO antibody pending  Ct Angio Head Jennings Or Wo Contrast Result Date: 02/07/2019 CLINICAL DATA:  Concern for vasculitis.  Blurry vision for 2 months. EXAM: CT ANGIOGRAPHY HEAD TECHNIQUE: Multidetector CT imaging of the head was performed using the standard protocol during bolus administration of intravenous contrast. Multiplanar CT image reconstructions and MIPs were obtained to evaluate the vascular anatomy. CONTRAST:  39mL OMNIPAQUE IOHEXOL 350 MG/ML SOLN COMPARISON:  Annette MRI 02/06/2019 FINDINGS: POSTERIOR CIRCULATION: --Vertebral arteries: Diminutive right vertebral artery terminates in PICA. Left V4 segment is normal. --Posterior inferior cerebellar arteries (PICA): Normal --Anterior inferior cerebellar arteries (AICA): Patent origins from the basilar artery. --Basilar artery: Normal. --Superior cerebellar arteries: Normal. --Posterior cerebral arteries: Normal. Both originate from the basilar artery. Posterior communicating arteries (p-comm) are diminutive or absent. ANTERIOR CIRCULATION: --Intracranial internal carotid arteries: Normal. --Anterior cerebral arteries (ACA): Normal. Both A1 segments are present. Patent anterior communicating artery (a-comm). --Middle cerebral arteries (MCA): Normal. Trace amount of air in the right lateral ventricle frontal horn. This is an incidental finding that may be seen following lumbar puncture. IMPRESSION: 1. No evidence for CNS vasculitis. 2. No emergent large vessel occlusion or flow-limiting stenosis. Electronically Signed   By:  Ulyses Jarred M.D.   On: 02/07/2019 03:08    Mr Annette Jennings And Wo Contrast Result Date: 02/06/2019  IMPRESSION: 1.  Essentially negative MRI appearance of the orbits (mildly disconjugate gaze, and CSF in both optic nerve root sleeves, see #3). 2. Highly advanced for age but nonspecific signal abnormality scattered in the bilateral cerebral white matter and deep gray nuclei. Comparatively minor involvement of the brainstem. No associated enhancement or dural thickening, arguing against neurosarcoidosis. Although CSF analysis for Ace level may be valuable. Other differential considerations include accelerated/hereditary small vessel ischemia, sequelae of hypercoagulable state, vasculitis, prior infection or less likely demyelination. 3. Superimposed partially empty sella appearance, along with an effaced appearance of the transverse/sigmoid sinus junctions and capacious optic nerve root sleeves. Consider also idiopathic intracranial hypertension (pseudotumor cerebri). Electronically Signed   By: Genevie Ann M.D.   On: 02/06/2019 18:53     Dg Lumbar Puncture Fluoro Guide Result Date: 02/07/2019  IMPRESSION: 1. Successful fluoroscopically guided acquiring 11 mL CSF for laboratory analysis. Opening pressure 19 cm of water. Electronically Signed   By: Lajean Manes M.D.   On: 02/07/2019 12:04    Annette Quill PA-C [facility] [phone]   Assessment: 48 year old female presenting with severe peripheral vision loss that has been declining for multiple months.  Patient was sent to the hospital by Dr. Manuella Jennings, her ophthalmologist.   1. LP that was obtained did not show any abnormalities.  Opening pressure was within normal limits, ruling out pseudotumor cerebri.   2. MRI with and without contrast showed no enhancement of the dura, or abnormal dural thickening.  No optic nerve enhancement noted.  Still questioning if this is an inflammatory process thus ACE was sent.  Recommendations: 1. Continue Solu-Medrol 1000 mg IV qd for 5 days. We spoke with staff at Dr. Trena Platt office, who verified that they are in agreement  regarding treatment with pulsed-dose steroids. This should be continued as an inpatient.  2. GI prophylaxis. Monitor CBG and obtain daily CBC and chem7 while on steroids.    Electronically signed: Dr. Kerney Elbe 02/08/2019, 10:37 AM

## 2019-02-08 NOTE — Progress Notes (Signed)
Refused bed alarm on.

## 2019-02-08 NOTE — Progress Notes (Signed)
Family Medicine Teaching Service Daily Progress Note Intern Pager: [phone]  Patient name: Annette Jennings Medical record number: [id number] Date of birth: 05-03-1971 Age: 48 y.o. Gender: female  Primary Care Provider: Patient, No Pcp Per Consultants: IR, neurology Code Status: Full  Pt Overview and Major Events to Date:  9/19- Admitted  9/20- Solumedrol started  Assessment and Plan: Annette Jennings is a 48 y.o. female presenting with gradual onset, progressively worsening bilateral vision loss. PMH is significant for elevated blood pressure, previous substance abuse, chronic methadone use.   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 Patient has mild improvement today, ports that color seemed brighter in her right eye on improved peripheral vision and right.  Unclear etiology thus far, CTA head without any evidence of CNS vasculitis or vessel occlusion/stenosis.  LP results WNL, normal opening pressure.  Pt significantly distressed by symptoms, does not feel comfortable going home on her own. Solumedrol started per neurology/ophto recommendations for likely autoimmune cause, IV solumedrol dose is best for possible autoimmune disease, no reasonable PO equivalent of prednisone. Neuro recommends 5 days of IV solumedrol. HIV and RPR both nonreactive. -Neurology on board, appreciate recommendations -Follow-up LP CSF cx, NG at <24 hours -Follow-up Ana, dsDNA, rnp, smith, anti scl70, ssa, ssb, antichromatin, jo-1,centromere b, bartonella, cmvlabs, NMO ab per neuro/ophtho recs -Monitor visual changes - OT eval  Elevated blood pressure: Improving. SBP 144/83 this a.m.  Intermittently ranging from 450T-888K systolic overnight.  We will continue to monitor for now, recommend establishing follow-up with primary care provider for hypertensive therapy. - Monitor BP - If remains elevated, will consider starting controlling therapy - Case management consult for establishing PCP, given  list of options  Previous substance abuse, methadone use: Stable. 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  Daily methadone use through [facility] in [place]. - Dosage verified with clinic through pharmacy team on 9/19 - Methadone 120 mg nightly - ekg for baseline  QTc, manually calculated QTc at 442m, no prolongation  Left adrenal mass on CT: Incidental finding of a 2.6 x 2.1 cm left adrenal mass.  - Recommend CT/MRI outpatient for further evaluation   Elevated alkaline phosphatase: Stable. Alk phos 171 today, from 187 on admission. Bilirubin, AST/ALT wnl. -GGT elevated to 55 -Consider liver U/S as an outpatient  Tobacco use: Stable. Smokes 1-2 cigarettes daily.  Will hold on nicotine patch for now.  FEN/GI: Regular diet Prophylaxis: Lovenox  Disposition:  Patient requires IV Solu-Medrol treatment for 5 total days, plan for discharge on 9/24  Subjective:  Patient reports improvement in vision today.  Colors in her right eye appear brighter, and she can see more peripheral vision minimally.  She still has double vision, she still has almost no peripheral vision on left eye.  Otherwise no complaints.  Objective: Temp:  [98 F (36.7 C)-98.8 F (37.1 C)] 98.6 F (37 C) (09/21 0809) Pulse Rate:  [71-90] 90 (09/21 0809) Resp:  [15-20] 16 (09/21 0344) BP: (142-170)/(74-110) 170/110 (09/21 0809) SpO2:  [97 %-100 %] 97 % (09/21 0809) Physical Exam: General: Alert, NAD HEENT: NCAT, MMM, EOMI, PERRLA.  Bilateral visual loss within superior/inferior/peripheral fields to about 15 degrees on L, and 60 degrees on R. Lungs: Clear bilaterally, no increased WOB  Abdomen: soft, non-tender, non-distended, normoactive BS Neuro: Alert and oriented, no focal neuro deficits on exam Msk: Moves all extremities spontaneously  Ext: Warm, dry, 2+ dorsalis pedis pulses   Laboratory: Recent Labs  Lab 02/06/19 1327  WBC  8.9  HGB 13.3  HCT 40.4  PLT 290   Recent  Labs  Lab 02/06/19 1327 02/07/19 0411 02/08/19 0642  NA 136 136 136  K 3.5 3.2* 3.8  CL 100 100 101  CO2 '26 26 26  ' BUN '10 8 7  ' CREATININE 0.79 0.71 0.75  CALCIUM 9.3 9.0 9.5  PROT 8.5* 7.8 8.6*  BILITOT 0.9 0.7 0.6  ALKPHOS 187* 170* 171*  ALT 40 36 38  AST 45* 40 37  GLUCOSE 105* 101* 177*    Imaging/Diagnostic Tests: Dg Lumbar Puncture Fluoro Guide  Result Date: 02/07/2019 CLINICAL DATA:  Patient presents for fluoroscopic guided lumbar puncture. Patient abnormal MRI and vision loss. EXAM: DIAGNOSTIC LUMBAR PUNCTURE UNDER FLUOROSCOPIC GUIDANCE FLUOROSCOPY TIME:  Fluoroscopy Time:  1 minutes and 20 seconds Radiation Exposure Index (if provided by the fluoroscopic device): 60.9 mGy Number of Acquired Spot Images: 7 series with 19 images PROCEDURE: Informed consent was obtained from the patient prior to the procedure, including potential complications of headache, allergy, and pain. With the patient prone, the lower back was prepped with Betadine. 1% Lidocaine was used for local anesthesia. Lumbar puncture was performed at the L4-L5 level using a 22 gauge needle with return of initially bloody but subsequently clear CSF with an opening pressure of 19 cm water. Eleven ml of CSF were obtained for laboratory studies. The patient tolerated the procedure well and there were no apparent complications. IMPRESSION: 1. Successful fluoroscopically guided acquiring 11 mL CSF for laboratory analysis. Opening pressure 19 cm of water. Electronically Signed   By: Lajean Manes M.D.   On: 02/07/2019 12:04    Gladys Damme, MD 02/08/2019, 8:32 AM PGY-1, [place] Intern pager: [phone], text pages welcome

## 2019-02-09 DIAGNOSIS — I10 Essential (primary) hypertension: Secondary | ICD-10-CM

## 2019-02-09 LAB — CBC
HCT: 39.9 % (ref 36.0–46.0)
Hemoglobin: 13 g/dL (ref 12.0–15.0)
MCH: 30.4 pg (ref 26.0–34.0)
MCHC: 32.6 g/dL (ref 30.0–36.0)
MCV: 93.4 fL (ref 80.0–100.0)
Platelets: 284 10*3/uL (ref 150–400)
RBC: 4.27 MIL/uL (ref 3.87–5.11)
RDW: 13.7 % (ref 11.5–15.5)
WBC: 15.2 10*3/uL — ABNORMAL HIGH (ref 4.0–10.5)
nRBC: 0 % (ref 0.0–0.2)

## 2019-02-09 LAB — BASIC METABOLIC PANEL
Anion gap: 11 (ref 5–15)
BUN: 10 mg/dL (ref 6–20)
CO2: 25 mmol/L (ref 22–32)
Calcium: 9.5 mg/dL (ref 8.9–10.3)
Chloride: 100 mmol/L (ref 98–111)
Creatinine, Ser: 0.78 mg/dL (ref 0.44–1.00)
GFR calc Af Amer: >60 mL/min (ref >60)
GFR calc non Af Amer: >60 mL/min (ref >60)
Glucose, Bld: 140 mg/dL — ABNORMAL HIGH (ref 70–99)
Potassium: 4 mmol/L (ref 3.5–5.1)
Sodium: 136 mmol/L (ref 135–145)

## 2019-02-09 LAB — GLUCOSE, CAPILLARY: Glucose-Capillary: 124 mg/dL — ABNORMAL HIGH (ref 70–99)

## 2019-02-09 MED ORDER — DIPHENHYDRAMINE HCL 50 MG/ML IJ SOLN: 12.5000 mg | Freq: Once | INTRAMUSCULAR | Status: DC

## 2019-02-09 MED ORDER — AMLODIPINE BESYLATE 10 MG PO TABS
10.0000 mg | ORAL_TABLET | Freq: Every day | ORAL | Status: DC
  Administered 2019-02-09 – 2019-02-12 (×4): 10 mg via ORAL
  Filled 2019-02-09 (×4): qty 1

## 2019-02-09 MED ORDER — SODIUM CHLORIDE 0.9% FLUSH: 10.0000 mL | INTRAVENOUS | Status: DC | PRN

## 2019-02-09 MED ORDER — DIPHENHYDRAMINE HCL 25 MG PO CAPS
50.0000 mg | ORAL_CAPSULE | Freq: Once | ORAL | Status: AC
  Administered 2019-02-09: 50 mg via ORAL
  Filled 2019-02-09: qty 2

## 2019-02-09 NOTE — Plan of Care (Signed)
Patient stable, discussed POC with patient, agreeable with plan, denies question/concerns at this time.  

## 2019-02-09 NOTE — Progress Notes (Signed)
VAST consulted to place IV access. Arrived on unit and spoke with pt's nurse. She stated pt was receiving IV steroids for unknown period of time and last IV was started in upper arm with ultrasound. In agreement, midline would be appropriate, but this nurse cannot complete. VAST colleagues notified of need.

## 2019-02-09 NOTE — Progress Notes (Signed)
Family Medicine Teaching Service Daily Progress Note Intern Pager: [phone]  Patient name: Annette Jennings Medical record number: [id number] Date of birth: 1970/10/31 Age: 48 y.o. Gender: female  Primary Care Provider: Patient, No Pcp Per Consultants: IR, neurology Code Status: Full  Pt Overview and Major Events to Date:  9/19- Admitted  9/20- Solumedrol started  Assessment and Plan: Annette Jennings is a 48 y.o. female presenting with gradual onset, progressively worsening bilateral vision loss. PMH is significant for elevated blood pressure, previous substance abuse, chronic methadone use.   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 No change in vision from mild improvement yesterday: improved color and peripheral vision in right eye.  She still has double vision, and almost no peripheral vision in left eye.  Unclear etiology thus far, most likely autoimmune in nature. Pt significantly distressed by symptoms, does not feel comfortable going home on her own. Solumedrol started per neurology/ophto recommendations for likely autoimmune cause, IV solumedrol dose is best for possible autoimmune disease for 5 days, no reasonable PO equivalent of prednisone.  Patient is not a candidate for home health due to insurance.  Plan to discharge -Neurology on board, appreciate recommendations -Follow-up LP CSF cx, NG x2d -Follow-up Ana, dsDNA, rnp, smith, anti scl70, ssa, ssb, antichromatin, jo-1,centromere b, bartonella, cmvlabs, NMO ab per neuro/ophtho recs -Monitor visual changes - OT eval  Elevated blood pressure: Improving. SBP 153/91 this a.m.  Intermittently ranging from 657X-038B systolic overnight.  Will begin hypertensive therapy. - Monitor BP - Start Amlodipine 10 mg - Case management consult for establishing PCP, given list of options  Previous substance abuse, methadone use: Stable. 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  Daily methadone use through [facility] in [place]. - Dosage verified with clinic through pharmacy team on 9/19 - Methadone 120 mg nightly - ekg for baseline  QTc, manually calculated QTc at 466m, no prolongation  Left adrenal mass on CT: Incidental finding of a 2.6 x 2.1 cm left adrenal mass.  - Recommend CT/MRI outpatient for further evaluation   Elevated alkaline phosphatase: Stable. Alk phos 171 on 9/21, from 187 on admission. Bilirubin, AST/ALT wnl. -GGT elevated to 55 -Consider liver U/S as an outpatient  Tobacco use: Stable. Smokes 1-2 cigarettes daily.  Will hold on nicotine patch for now.  FEN/GI: Regular diet Prophylaxis: Lovenox  Disposition:  Patient requires IV Solu-Medrol treatment for 5 total days, plan for discharge on 9/24  Subjective:  No changes in vision today, but she maintains improvement from yesterday.  Reports that she feels better overall, though still worried about her eyesight.  Objective: Temp:  [97.7 F (36.5 C)-98.6 F (37 C)] 98.5 F (36.9 C) (09/22 0345) Pulse Rate:  [80-110] 81 (09/22 0345) Resp:  [17] 17 (09/22 0345) BP: (146-174)/(70-138) 153/91 (09/22 0345) SpO2:  [94 %-100 %] 94 % (09/22 0345) Physical Exam: General: Alert, NAD HEENT: NCAT, MMM, EOMI, PERRLA.  Bilateral visual loss within superior/inferior/peripheral fields to about 15 degrees on L, and 60 degrees on R. Lungs: Clear bilaterally, no increased WOB  Abdomen: soft, non-tender, non-distended, normoactive BS Neuro: Alert and oriented, no focal neuro deficits on exam Msk: Moves all extremities spontaneously  Ext: Warm, dry, 2+ dorsalis pedis pulses   Laboratory: Recent Labs  Lab 02/06/19 1327 02/09/19 0347  WBC 8.9 15.2*  HGB 13.3 13.0  HCT 40.4 39.9  PLT 290 284   Recent Labs  Lab 02/06/19 1327 02/07/19 0411 02/08/19 0642 02/09/19 0347  NA 136  136 136 136  K 3.5 3.2* 3.8 4.0  CL 100 100 101 100  CO2 '26 26 26 25  ' BUN '10 8 7 10  ' CREATININE 0.79 0.71 0.75 0.78  CALCIUM  9.3 9.0 9.5 9.5  PROT 8.5* 7.8 8.6*  --   BILITOT 0.9 0.7 0.6  --   ALKPHOS 187* 170* 171*  --   ALT 40 36 38  --   AST 45* 40 37  --   GLUCOSE 105* 101* 177* 140*    Imaging/Diagnostic Tests: No results found.  Gladys Damme, MD 02/09/2019, 6:16 AM PGY-1, [place] Intern pager: [phone], text pages welcome

## 2019-02-09 NOTE — Progress Notes (Signed)
FPTS Interim Progress Note  S: Annette Jennings had an allergic reaction with a diffuse erythematous rash and urticaria along her bilateral upper extremities, neck, chest.  No tingling or swelling of lips or tongue, no trouble breathing.  She states she had a banana this morning around 10 AM, she has not eaten a banana in many years, but that is the only thing that she can think of that has changed recently.  She is not used any different soaps or perfumes as she brought hers from home.  No other clear source for allergic reaction.  Gave Benadryl 50 mg one-time dose, and patient has improved with decreasing erythema and urticaria.  Patient has not received IV Solu-Medrol yet today as her IV came out, one is being placed now.  O: BP (!) 169/106 (BP Location: Left Arm)   Pulse 81   Temp 98.2 F (36.8 C) (Oral)   Resp 18   Ht 5\' 7"  (1.702 m)   Wt 89.2 kg   SpO2 97%   BMI 30.80 kg/m   Gen: Anxious woman very alert sitting in bed Mouth: Oropharynx clear Pulmonary: Clear to auscultation bilaterally Cardiac: Regular rate and rhythm, no murmur/rub/gallop Skin: See photos      A/P: Annette Jennings is a 48 year old woman here for bilateral progressively worsening decreasing eyesight.  Had improved yesterday and that improvement was maintained today, until she had the urticaria and erythematous rash, at which time her peripheral vision in her right eye decreased.  After receiving Benadryl and the erythema and urticaria went diminishing, she reports that her eyesight is returning to the improvement that had previously been seen yesterday and this morning.  Patient just had IV replaced, will be receiving IV Solu-Medrol shortly, expect that to also improve the urticaria and erythema.  Annette Damme, MD 02/09/2019, 4:43 PM PGY-1, [place] Medicine Service pager [phone]

## 2019-02-10 DIAGNOSIS — L508 Other urticaria: Secondary | ICD-10-CM

## 2019-02-10 LAB — CSF CULTURE W GRAM STAIN: Culture: NO GROWTH

## 2019-02-10 NOTE — Progress Notes (Addendum)
Family Medicine Teaching Service Daily Progress Note Intern Pager: [phone]  Patient name: Annette Jennings Medical record number: [id number] Date of birth: 21-Aug-1970 Age: 48 y.o. Gender: female  Primary Care Provider: Patient, No Pcp Per Consultants: IR, neurology Code Status: Full  Pt Overview and Major Events to Date:  9/19- Admitted  9/20- Solumedrol started  Assessment and Plan: Annette Jennings is a 48 y.o. female presenting with gradual onset, progressively worsening bilateral vision loss. PMH is significant for elevated blood pressure, previous substance abuse, chronic methadone use.   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 Patient has returned to her improvement seen 2 days ago in vision: Improved color and peripheral vision in right eye.  She still has double vision, and almost no peripheral vision in left eye, but states that she can notice a difference with her glasses on and off now.  Unclear etiology thus far, most likely autoimmune in nature.  Hives have fully resolved since yesterday.  Solumedrol started per neurology/ophto recommendations for likely autoimmune cause, day 4/5 of IV Solu-Medrol. Patient is not a candidate for home health due to insurance.  Plan to discharge possibly tomorrow after last dose of IV Solu-Medrol. -Neurology on board, appreciate recommendations, will f/u outpatient -- Will follow up with ophtho, Dr. Manuella Ghazi, outpatient - LP CSF cx, NG x2d -Follow-up Ana, dsDNA, rnp, smith, anti scl70, ssa, ssb, antichromatin, jo-1,centromere b, bartonella, cmvlabs, NMO ab per neuro/ophtho recs -Monitor visual changes - OT recommending HH, will consult SW to assist with [facility] setup  Elevated blood pressure: Improving. BP 146/92 this a.m.  Intermittently ranging from 546E-703J systolic overnight.  Will begin hypertensive therapy. - Monitor BP - Continue Amlodipine 10 mg - Case management consult for establishing PCP, given list of options  Previous  substance abuse, methadone use: Stable. 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  Daily methadone use through [facility] in [place]. - Dosage verified with clinic through pharmacy team on 9/19 - Methadone 120 mg nightly - ekg for baseline  QTc, manually calculated QTc at 439m, no prolongation  Left adrenal mass on CT: Incidental finding of a 2.6 x 2.1 cm left adrenal mass.  - Recommend CT/MRI outpatient for further evaluation   Elevated alkaline phosphatase: Stable. Alk phos 171 on 9/21, from 187 on admission. Bilirubin, AST/ALT wnl. -GGT elevated to 55 -Consider liver U/S as an outpatient  Tobacco use: Stable. Smokes 1-2 cigarettes daily.  Will hold on nicotine patch for now.  FEN/GI: Regular diet Prophylaxis: Lovenox  Disposition:  Patient requires IV Solu-Medrol treatment for 5 total days, plan for discharge on 9/24  Subjective:  Patient feels better today, now that hives have gone.  She is noticing a difference with her glasses on and off now.  She is regained the improvement that she made on day 2 of IV Solu-Medrol.  Objective: Temp:  [98.1 F (36.7 C)-98.6 F (37 C)] 98.4 F (36.9 C) (09/23 0419) Pulse Rate:  [70-87] 79 (09/23 0419) Resp:  [17-18] 18 (09/23 0419) BP: (146-169)/(86-106) 146/92 (09/23 0419) SpO2:  [94 %-97 %] 97 % (09/23 0419) Physical Exam: General: Alert, NAD HEENT: NCAT, MMM, EOMI, PERRLA.  Bilateral visual loss within superior/inferior/peripheral fields to about 15 degrees on L, and 60 degrees on R. Lungs: Clear bilaterally, no increased WOB  Abdomen: soft, non-tender, non-distended, normoactive BS Neuro: Alert and oriented, no focal neuro deficits on exam Msk: Moves all extremities spontaneously  Ext: Warm, dry, 2+ dorsalis pedis pulses   Laboratory:  Recent Labs  Lab 02/06/19 1327 02/09/19 0347  WBC 8.9 15.2*  HGB 13.3 13.0  HCT 40.4 39.9  PLT 290 284   Recent Labs  Lab 02/06/19 1327 02/07/19 0411  02/08/19 0642 02/09/19 0347  NA 136 136 136 136  K 3.5 3.2* 3.8 4.0  CL 100 100 101 100  CO2 '26 26 26 25  ' BUN '10 8 7 10  ' CREATININE 0.79 0.71 0.75 0.78  CALCIUM 9.3 9.0 9.5 9.5  PROT 8.5* 7.8 8.6*  --   BILITOT 0.9 0.7 0.6  --   ALKPHOS 187* 170* 171*  --   ALT 40 36 38  --   AST 45* 40 37  --   GLUCOSE 105* 101* 177* 140*    Imaging/Diagnostic Tests: No results found.  Gladys Damme, MD 02/10/2019, 6:32 AM PGY-1, [place] Intern pager: [phone], text pages welcome

## 2019-02-10 NOTE — Progress Notes (Signed)
Occupational Therapy Treatment Patient Details Name: Annette Jennings MRN: [id number] DOB: Aug 07, 1970 Today's Date: 02/10/2019    History of present illness This 48 y.o. female admitted with progressively worsening bil. vision loss > unclear etiology thus far and work up underway.  PMH includes HTN, previous h/o substance abuse, chronic mentadone use   OT comments  Patient agreeable to OT.  Reports increased frustration with vision loss, limited improvements in peripheral vision. Reports glasses are helping blurred central vision.  Continues to demonstrate functional reach and grasp without over/undershooting. Reports light sensitive in hallway. Supervision for mobility in room, min guard in hallway for safety as patient is guarded and fearful.  Initiated education on compensatory techniques, specifically scanning, with poor carryover of techniques and at times resistive to education. Declined education and resources offered for services of the blind, which decreased engagement in remainder of session. Pt reports having support for transport to/from OP OT services (friend in room at time of session as well).  Will follow.       Follow Up Recommendations  Outpatient OT;Supervision - Intermittent    Equipment Recommendations       Recommendations for Other Services      Precautions / Restrictions Precautions Precautions: Fall Precaution Comments: due to vision loss  Restrictions Weight Bearing Restrictions: No       Mobility Bed Mobility Overal bed mobility: Independent                Transfers Overall transfer level: Needs assistance   Transfers: Sit to/from Stand Sit to Stand: Supervision         General transfer comment: superivison for safety     Balance Overall balance assessment: Mild deficits observed, not formally tested(due to vision)                                         ADL either performed or assessed with clinical judgement   ADL  Overall ADL's : Needs assistance/impaired                                     Functional mobility during ADLs: Supervision/safety;Min guard General ADL Comments: supervision for basic ADLs, min guard for mobility in hallway with noted guarded mobility from vision loss      Vision   Additional Comments: pt able to track all planes, continues to loss of periphreal vision (R better than L) as well as superior vision loss, central sparing. Able to mange and read from phone without difficulties. Not over or undershooting when reaching for objects.  Reports light sensitive.    Perception     Praxis      Cognition Arousal/Alertness: Awake/alert Behavior During Therapy: WFL for tasks assessed/performed Overall Cognitive Status: Within Functional Limits for tasks assessed                                 General Comments: grossly [facility]         Exercises     Shoulder Instructions       General Comments Initated education on compensatory techniques, specifically scanning with eyes as well as head turns.  Completed mobility in hallway to scan and locate items, with poor scanning abiliity without cueing and becoming frustrated when therapist cued pt  to turn her head voicing "Well I can see it if I turn my head!". Pt hesistant with mobility in hallway, guarded and fearful; educated to visually scan new enviorments to prepare herself and pt verbalizes understanding, but poor carryover. Educated on recommendations for OP OT, reports having support to assist driving her to OP.  Attempted to provide resource handout "The services of the blind" and educated on program but patient refused information.     Pertinent Vitals/ Pain       Pain Assessment: No/denies pain  Home Living                                          Prior Functioning/Environment              Frequency  Min 2X/week        Progress Toward Goals  OT Goals(current goals can  now be found in the care plan section)  Progress towards OT goals: Progressing toward goals  Acute Rehab OT Goals Patient Stated Goal: to see better  OT Goal Formulation: With patient  Plan Discharge plan needs to be updated;Frequency remains appropriate    Co-evaluation                 AM-PAC OT "6 Clicks" Daily Activity     Outcome Measure   Help from another person eating meals?: None Help from another person taking care of personal grooming?: A Little Help from another person toileting, which includes using toliet, bedpan, or urinal?: A Little Help from another person bathing (including washing, rinsing, drying)?: A Little Help from another person to put on and taking off regular upper body clothing?: A Little Help from another person to put on and taking off regular lower body clothing?: A Little 6 Click Score: 19    End of Session    OT Visit Diagnosis: Low vision, both eyes (H54.2)   Activity Tolerance Patient tolerated treatment well   Patient Left in bed;with call bell/phone within reach   Nurse Communication Mobility status        Time: 1435-1456 OT Time Calculation (min): 21 min  Charges: OT General Charges $OT Visit: 1 Visit OT Treatments $Self Care/Home Management : 8-22 mins  Delight Stare, OT Acute Rehabilitation Services Pager [phone] Office [phone]    Delight Stare 02/10/2019, 4:41 PM

## 2019-02-11 ENCOUNTER — Encounter (HOSPITAL_COMMUNITY): Payer: Self-pay | Admitting: Emergency Medicine

## 2019-02-11 LAB — BASIC METABOLIC PANEL
Anion gap: 11 (ref 5–15)
BUN: 19 mg/dL (ref 6–20)
CO2: 28 mmol/L (ref 22–32)
Calcium: 9.3 mg/dL (ref 8.9–10.3)
Chloride: 97 mmol/L — ABNORMAL LOW (ref 98–111)
Creatinine, Ser: 0.96 mg/dL (ref 0.44–1.00)
GFR calc Af Amer: >60 mL/min (ref >60)
GFR calc non Af Amer: >60 mL/min (ref >60)
Glucose, Bld: 117 mg/dL — ABNORMAL HIGH (ref 70–99)
Potassium: 4.1 mmol/L (ref 3.5–5.1)
Sodium: 136 mmol/L (ref 135–145)

## 2019-02-11 LAB — CBC
HCT: 40.6 % (ref 36.0–46.0)
Hemoglobin: 14 g/dL (ref 12.0–15.0)
MCH: 31.8 pg (ref 26.0–34.0)
MCHC: 34.5 g/dL (ref 30.0–36.0)
MCV: 92.3 fL (ref 80.0–100.0)
Platelets: 284 10*3/uL (ref 150–400)
RBC: 4.4 MIL/uL (ref 3.87–5.11)
RDW: 13.7 % (ref 11.5–15.5)
WBC: 17.1 10*3/uL — ABNORMAL HIGH (ref 4.0–10.5)
nRBC: 0 % (ref 0.0–0.2)

## 2019-02-11 MED ORDER — DOXYCYCLINE HYCLATE 100 MG PO TABS
100.0000 mg | ORAL_TABLET | Freq: Two times a day (BID) | ORAL | Status: DC
  Administered 2019-02-11 – 2019-02-12 (×3): 100 mg via ORAL
  Filled 2019-02-11 (×3): qty 1

## 2019-02-11 MED ORDER — SODIUM CHLORIDE 0.9 % IV SOLN
1000.0000 mg | Freq: Once | INTRAVENOUS | Status: AC
  Administered 2019-02-12: 1000 mg via INTRAVENOUS
  Filled 2019-02-11: qty 8

## 2019-02-11 MED ORDER — PREDNISONE 50 MG PO TABS: 50.0000 mg | ORAL_TABLET | Freq: Every day | ORAL | Status: DC

## 2019-02-11 NOTE — Progress Notes (Signed)
Family Medicine Teaching Service Daily Progress Note Intern Pager: [phone]  Patient name: Annette Jennings Medical record number: [id number] Date of birth: [birth year] Age: 48 y.o. Gender: female  Primary Care Provider: Patient, No Pcp Per Consultants: IR, neurology Code Status: Full  Pt Overview and Major Events to Date:  9/19- Admitted  9/20- Solumedrol started  Assessment and Plan: Annette Jennings is a 48 y.o. female presenting with gradual onset, progressively worsening bilateral vision loss. PMH is significant for elevated blood pressure, previous substance abuse, chronic methadone use.   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 Patient has unfortunately lost what little improvement she had in her peripheral vision of her right eye since yesterday.  She is at what appears to be her new baseline of tunnel vision, the  vision level she had at admission.  Etiology remains unclear thus far, but most likely autoimmune in nature as MRI ruled out neurosarcoidosis and other labs have been nonspecific or within normal limits so far.  Will make an ambulatory referral to rheumatology for further work-up.  It is possible that a tickborne infection such as Bartonella could be causing these symptoms for which doxycycline is the treatment, sources recommend 1 month by mouth treatment, as absorption by mouth and IV are the same.  Patient reports that she had (what she believed to be) a tick bite about a year ago on her left leg, but she has not done much walking this summer as her eyesight has prevented her from doing so.  Today is day 5/5 IV Solu-Medrol per neurology and ophthalmology recommendations.  Will begin a taper of prednisone.  Patient is not a candidate for home health due to insurance.   -Neurology on board, appreciate recommendations, will f/u outpatient -- Will follow up with ophtho, Dr. Manuella Ghazi, outpatient on September 30 at 12:30 PM - LP CSF cx, NG x2d -Follow-up Ana, dsDNA, rnp,  smith, anti scl70, ssa, ssb, antichromatin, jo-1,centromere b, bartonella, cmvlabs, NMO ab per neuro/ophtho recs - Ambulatory referral to rheumatology -Start Doxycycline 100 mg po - Start prednisone taper -Monitor visual changes - OT recommending HH, will consult SW to assist with [facility] setup  Elevated blood pressure: Improving. BP 146/92 this a.m.  Intermittently ranging from 527P-824M systolic overnight.  Continue hypertensive therapy. - Monitor BP - Continue Amlodipine 10 mg - Case management consult for establishing PCP, given list of options  Previous substance abuse, methadone use: Stable. 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  Daily methadone use through [facility] in [place]. - Dosage verified with clinic through pharmacy team on 9/19 - Methadone 120 mg nightly - ekg for baseline  QTc, manually calculated QTc at 446m, no prolongation  Left adrenal mass on CT: Incidental finding of a 2.6 x 2.1 cm left adrenal mass.  - Recommend CT/MRI outpatient for further evaluation   Elevated alkaline phosphatase: Stable. Alk phos 171 on 9/21, from 187 on admission. Bilirubin, AST/ALT wnl. -GGT elevated to 55 -Consider liver U/S as an outpatient  Tobacco use: Stable. Smokes 1-2 cigarettes daily.  Will hold on nicotine patch for now.  FEN/GI: Regular diet Prophylaxis: Lovenox  Disposition:  Patient requires IV Solu-Medrol treatment for 5 total days, plan for discharge on 9/25  Subjective:  Still significantly distressed regarding her eyesight loss, states that she does not wish to leave the hospital until she either regains some eyesight or has an answer to why this has occurred.  This provider has discussed with her that although  this problem is significantly distressing, we may not have an answer as to why this happened anytime soon or maybe ever.  We may also not be able to restore vision.  Will refer patient to rheumatology as an outpatient as this is  likely autoimmune in nature.  Have offered patient emotional support, and continue to attempt to set up all possible resources to assist her transition.  Objective: Temp:  [98.2 F (36.8 C)-99.1 F (37.3 C)] 98.6 F (37 C) (09/24 1149) Pulse Rate:  [68-89] 68 (09/24 1149) Resp:  [18] 18 (09/24 1149) BP: (148-164)/(86-114) 148/86 (09/24 1149) SpO2:  [96 %-100 %] 98 % (09/24 1149) Physical Exam: General: Alert, NAD HEENT: NCAT, MMM, EOMI, PERRLA.  Bilateral visual loss within superior/inferior/peripheral fields to about 15 degrees bilaterally Lungs: Clear bilaterally, no increased WOB  Abdomen: soft, non-tender, non-distended, normoactive BS Neuro: Alert and oriented, no focal neuro deficits on exam Msk: Moves all extremities spontaneously  Ext: Warm, dry, 2+ dorsalis pedis pulses   Laboratory: Recent Labs  Lab 02/06/19 1327 02/09/19 0347 02/11/19 0625  WBC 8.9 15.2* 17.1*  HGB 13.3 13.0 14.0  HCT 40.4 39.9 40.6  PLT 290 284 284   Recent Labs  Lab 02/06/19 1327 02/07/19 0411 02/08/19 0642 02/09/19 0347 02/11/19 0625  NA 136 136 136 136 136  K 3.5 3.2* 3.8 4.0 4.1  CL 100 100 101 100 97*  CO2 '26 26 26 25 28  ' BUN '10 8 7 10 19  ' CREATININE 0.79 0.71 0.75 0.78 0.96  CALCIUM 9.3 9.0 9.5 9.5 9.3  PROT 8.5* 7.8 8.6*  --   --   BILITOT 0.9 0.7 0.6  --   --   ALKPHOS 187* 170* 171*  --   --   ALT 40 36 38  --   --   AST 45* 40 37  --   --   GLUCOSE 105* 101* 177* 140* 117*    Imaging/Diagnostic Tests: No results found.  Annette Damme, MD 02/11/2019, 5:55 PM PGY-1, [place] Intern pager: [phone], text pages welcome

## 2019-02-11 NOTE — Evaluation (Signed)
Physical Therapy Evaluation Patient Details Name: Annette Jennings MRN: [id number] DOB: 1970/07/02 Today's Date: 02/11/2019   History of Present Illness  This 48 y.o. female admitted with progressively worsening bil. vision loss > unclear etiology thus far and work up underway/continuing- per chart review, current hypothesis possibly autoimmune dysfunction.  PMH includes HTN, previous h/o substance abuse, chronic mentadone use  Clinical Impression   Patient received in bed, initially very reluctant to participate and attempting to refuse PT, but able to be convinced to participate in in-room session. Able to perform all mobility with Mod(I)-S today, continues to report loss of peripheral vision as well as tunnel vision/double and blurry vision in her available field, but generally able to navigate in room with occasional cues for items and obstacles in periphery. Education provided on benefit and value of attending outpatient sessions with an OT specializing in vision/vision loss and compensation strategies. She was left in bed with all needs met and questions/concerns addressed. At this time do not feel that patient will benefit from skilled PT services as all deficits are related to her vision loss, so will sign off on care in the hospital- however strongly recommend skilled services in the OP setting with a OT specializing in vision/vision loss compensation strategies moving forward. PT signing off as all needs can be met in the next venue of care- thank you for the referral.     Follow Up Recommendations Other (comment);Supervision for mobility/OOB(outpatient OT vision specialist)    Equipment Recommendations  None recommended by PT    Recommendations for Other Services       Precautions / Restrictions Precautions Precautions: Fall;Other (comment) Precaution Comments: poor vision Restrictions Weight Bearing Restrictions: No      Mobility  Bed Mobility Overal bed mobility:  Independent                Transfers Overall transfer level: Modified independent Equipment used: None Transfers: Sit to/from Stand Sit to Stand: Modified independent (Device/Increase time)         General transfer comment: distant S for safety, no physical assist given  Ambulation/Gait Ambulation/Gait assistance: Supervision Gait Distance (Feet): 40 Feet Assistive device: None Gait Pattern/deviations: Step-through pattern;Decreased stride length;Drifts right/left Gait velocity: reduced   General Gait Details: mild drift L/R likely due to vision issues, otherwise able to see and navigate obstacles in room when they are in her direct field of vision, requires intermittent cues for obstacles and items such as IV line in periphery  Stairs            Wheelchair Mobility    Modified Rankin (Stroke Patients Only)       Balance Overall balance assessment: Mild deficits observed, not formally tested                                           Pertinent Vitals/Pain Pain Assessment: Faces Pain Score: 0-No pain Faces Pain Scale: No hurt Pain Intervention(s): Monitored during session    Home Living Family/patient expects to be discharged to:: Private residence Living Arrangements: Alone Available Help at Discharge: Friend(s);Available PRN/intermittently Type of Home: Apartment Home Access: Elevator     Home Layout: One level Home Equipment: None Additional Comments: Pt reports she lives in an apartment, however, chart review indicates she lives in an extended stay hotel     Prior Function Level of Independence: Independent  Comments: Pt reports she has been independent, but has had worsening visual deficits for the past 3 weeks.  She reports a friend assists her with groceries and transportation, and friend is around a lot and can assist as needed.  She, however, is guarded and evasive with answers      Hand Dominance   Dominant  Hand: Right    Extremity/Trunk Assessment   Upper Extremity Assessment Upper Extremity Assessment: Defer to OT evaluation    Lower Extremity Assessment Lower Extremity Assessment: Overall WFL for tasks assessed    Cervical / Trunk Assessment Cervical / Trunk Assessment: Normal  Communication   Communication: No difficulties  Cognition Arousal/Alertness: Awake/alert Behavior During Therapy: WFL for tasks assessed/performed Overall Cognitive Status: Within Functional Limits for tasks assessed                                 General Comments: grossly [facility]       General Comments General comments (skin integrity, edema, etc.): resistant to participating in therapy, able to convince her to participate in in-room session; encouraged staying with friends/family and educated about value of seeing outpatient OT vision specialist    Exercises     Assessment/Plan    PT Assessment All further PT needs can be met in the next venue of care  PT Problem List Decreased safety awareness;Decreased coordination;Decreased balance       PT Treatment Interventions      PT Goals (Current goals can be found in the Care Plan section)  Acute Rehab PT Goals Patient Stated Goal: to see better  PT Goal Formulation: With patient Time For Goal Achievement: 02/24/19 Potential to Achieve Goals: Fair    Frequency     Barriers to discharge        Co-evaluation               AM-PAC PT "6 Clicks" Mobility  Outcome Measure Help needed turning from your back to your side while in a flat bed without using bedrails?: None Help needed moving from lying on your back to sitting on the side of a flat bed without using bedrails?: None Help needed moving to and from a bed to a chair (including a wheelchair)?: A Little Help needed standing up from a chair using your arms (e.g., wheelchair or bedside chair)?: A Little Help needed to walk in hospital room?: A Little Help needed climbing  3-5 steps with a railing? : A Little 6 Click Score: 20    End of Session   Activity Tolerance: Patient tolerated treatment well Patient left: in bed;with call bell/phone within reach   PT Visit Diagnosis: Unsteadiness on feet (R26.81);Difficulty in walking, not elsewhere classified (R26.2)    Time: 9753-0051 PT Time Calculation (min) (ACUTE ONLY): 12 min   Charges:   PT Evaluation $PT Eval Moderate Complexity: 1 Mod          Deniece Ree PT, DPT, CBIS  Supplemental Physical Therapist [place]    Pager [phone] Acute Rehab Office [phone]

## 2019-02-11 NOTE — Progress Notes (Signed)
Chaplain visited with patient and the patient declined the visit, instead chose to treat their emotional distress with [employer].  Chaplain offered services to the patient and informed the patient of their availability.  The chaplain does not need to follow-up.  Brion Aliment Chaplain Resident For questions concerning this note please contact me by pager [phone]

## 2019-02-12 LAB — BASIC METABOLIC PANEL
Anion gap: 11 (ref 5–15)
BUN: 20 mg/dL (ref 6–20)
CO2: 27 mmol/L (ref 22–32)
Calcium: 9.6 mg/dL (ref 8.9–10.3)
Chloride: 97 mmol/L — ABNORMAL LOW (ref 98–111)
Creatinine, Ser: 1.07 mg/dL — ABNORMAL HIGH (ref 0.44–1.00)
GFR calc Af Amer: >60 mL/min (ref >60)
GFR calc non Af Amer: >60 mL/min (ref >60)
Glucose, Bld: 120 mg/dL — ABNORMAL HIGH (ref 70–99)
Potassium: 3.4 mmol/L — ABNORMAL LOW (ref 3.5–5.1)
Sodium: 135 mmol/L (ref 135–145)

## 2019-02-12 LAB — CBC
HCT: 43.9 % (ref 36.0–46.0)
Hemoglobin: 15.2 g/dL — ABNORMAL HIGH (ref 12.0–15.0)
MCH: 31.9 pg (ref 26.0–34.0)
MCHC: 34.6 g/dL (ref 30.0–36.0)
MCV: 92 fL (ref 80.0–100.0)
Platelets: 231 10*3/uL (ref 150–400)
RBC: 4.77 MIL/uL (ref 3.87–5.11)
RDW: 13.5 % (ref 11.5–15.5)
WBC: 16.6 10*3/uL — ABNORMAL HIGH (ref 4.0–10.5)
nRBC: 0 % (ref 0.0–0.2)

## 2019-02-12 MED ORDER — METHADONE HCL 10 MG PO TABS
120.0000 mg | ORAL_TABLET | Freq: Every day | ORAL | Status: DC
  Administered 2019-02-12: 120 mg via ORAL
  Filled 2019-02-12: qty 12

## 2019-02-12 MED ORDER — PREDNISONE 10 MG PO TABS: 10.0000 mg | ORAL_TABLET | Freq: Every day | ORAL | 0 refills | Status: DC

## 2019-02-12 MED ORDER — PREDNISONE 10 MG PO TABS: ORAL_TABLET | ORAL | 0 refills | Status: AC

## 2019-02-12 MED ORDER — PREDNISONE 10 MG PO TABS: ORAL_TABLET | ORAL | 0 refills | Status: DC

## 2019-02-12 MED ORDER — PREDNISONE 10 MG PO TABS: 10.0000 mg | ORAL_TABLET | Freq: Every day | ORAL | 0 refills | Status: AC

## 2019-02-12 MED ORDER — AMLODIPINE BESYLATE 10 MG PO TABS: 10.0000 mg | ORAL_TABLET | Freq: Every day | ORAL | 2 refills | Status: DC

## 2019-02-12 MED ORDER — DOXYCYCLINE HYCLATE 100 MG PO TABS: 100.0000 mg | ORAL_TABLET | Freq: Two times a day (BID) | ORAL | 0 refills | Status: DC

## 2019-02-12 NOTE — Progress Notes (Addendum)
Subjective: Patient reports that she does not feel her peripheral vision is improving. She reports that she does not have double vision close up, but continues to have double vision when looking at a distance.   Objective: Current vital signs: BP (!) 166/93 (BP Location: Right Arm)   Pulse 72   Temp 98 F (36.7 C) (Oral)   Resp 19   Ht _0  (1.702 m)   Wt 89.2 kg   SpO2 98%   BMI 30.80 kg/m  Vital signs in last 24 hours: Temp:  [98.4 F (36.9 C)-99.1 F (37.3 C)] 98.9 F (37.2 C) (09/25 0853) Pulse Rate:  [68-93] 79 (09/25 0853) Resp:  [18-19] 19 (09/25 0853) BP: (148-176)/(86-118) 159/100 (09/25 0853) SpO2:  [98 %-100 %] 98 % (09/25 0853)  Intake/Output from previous day: 09/24 0701 - 09/25 0700 In: 320 [P.O.:320] Out: -  Intake/Output this shift: Total I/O In: 240 [P.O.:240] Out: -  Nutritional status:  Diet Order            Diet regular Room service appropriate? Yes; Fluid consistency: Thin  Diet effective now             Neurologic Exam: Mental Status: Alert, oriented, thought content appropriate.  Speech fluent without evidence of aphasia. Able to follow 3 step commands without difficulty. Cranial Nerves: II:  Visual fields grossly decreased in all peripheral fields III,IV, VI: Patient has a baseline exotropia of the right eye.  Pupils are equal and reactive. EOMs are intact V,VII: smile symmetric, Facial light touch sensation normal bilaterally Motor: Right : Upper extremity   5/5                                      Left:     Upper extremity   5/5             Lower extremity   5/5                                                  Lower extremity   5/5 Tone and bulk:normal tone throughout; no atrophy noted Sensory:  light touch intact throughout, bilaterally  Lab Results: Results for orders placed or performed during the hospital encounter of 02/06/19 (from the past 48 hour(s))  Basic metabolic panel     Status: Abnormal   Collection Time: 02/11/19   6:25 AM  Result Value Ref Range   Sodium 136 135 - 145 mmol/L   Potassium 4.1 3.5 - 5.1 mmol/L   Chloride 97 (L) 98 - 111 mmol/L   CO2 28 22 - 32 mmol/L   Glucose, Bld 117 (H) 70 - 99 mg/dL   BUN 19 6 - 20 mg/dL   Creatinine, Ser 0.96 0.44 - 1.00 mg/dL   Calcium 9.3 8.9 - 10.3 mg/dL   GFR calc non Af Amer >60 >60 mL/min   GFR calc Af Amer >60 >60 mL/min   Anion gap 11 5 - 15    Comment: Performed at [facility], [place] [address] [place] [address]  CBC     Status: Abnormal   Collection Time: 02/11/19  6:25 AM  Result Value Ref Range   WBC 17.1 (H) 4.0 - 10.5 K/uL   RBC 4.40 3.87 -  5.11 MIL/uL   Hemoglobin 14.0 12.0 - 15.0 g/dL   HCT 40.6 36.0 - 46.0 %   MCV 92.3 80.0 - 100.0 fL   MCH 31.8 26.0 - 34.0 pg   MCHC 34.5 30.0 - 36.0 g/dL   RDW 13.7 11.5 - 15.5 %   Platelets 284 150 - 400 K/uL   nRBC 0.0 0.0 - 0.2 %    Comment: Performed at [facility], [place] [address], [place], [place] [address]  Basic metabolic panel     Status: Abnormal   Collection Time: 02/12/19 11:07 AM  Result Value Ref Range   Sodium 135 135 - 145 mmol/L   Potassium 3.4 (L) 3.5 - 5.1 mmol/L   Chloride 97 (L) 98 - 111 mmol/L   CO2 27 22 - 32 mmol/L   Glucose, Bld 120 (H) 70 - 99 mg/dL   BUN 20 6 - 20 mg/dL   Creatinine, Ser 1.07 (H) 0.44 - 1.00 mg/dL   Calcium 9.6 8.9 - 10.3 mg/dL   GFR calc non Af Amer >60 >60 mL/min   GFR calc Af Amer >60 >60 mL/min   Anion gap 11 5 - 15    Comment: Performed at [facility], [place] [address], [place], [place] [address]  CBC     Status: Abnormal   Collection Time: 02/12/19 11:07 AM  Result Value Ref Range   WBC 16.6 (H) 4.0 - 10.5 K/uL   RBC 4.77 3.87 - 5.11 MIL/uL   Hemoglobin 15.2 (H) 12.0 - 15.0 g/dL   HCT 43.9 36.0 - 46.0 %   MCV 92.0 80.0 - 100.0 fL   MCH 31.9 26.0 - 34.0 pg   MCHC 34.6 30.0 - 36.0 g/dL   RDW 13.5 11.5 - 15.5 %   Platelets 231 150 - 400 K/uL   nRBC 0.0 0.0 - 0.2 %    Comment: Performed at [facility],  [place] [address], [place], [place] [address]    Recent Results (from the past 240 hour(s))  SARS CORONAVIRUS 2 (TAT 6-24 HRS) Nasopharyngeal Nasopharyngeal Swab     Status: None   Collection Time: 02/06/19 10:27 PM   Specimen: Nasopharyngeal Swab  Result Value Ref Range Status   SARS Coronavirus 2 NEGATIVE NEGATIVE Final    Comment: (NOTE) SARS-CoV-2 target nucleic acids are NOT DETECTED. The SARS-CoV-2 RNA is generally detectable in upper and lower respiratory specimens during the acute phase of infection. Negative results do not preclude SARS-CoV-2 infection, do not rule out co-infections with other pathogens, and should not be used as the sole basis for treatment or other patient management decisions. Negative results must be combined with clinical observations, patient history, and epidemiological information. The expected result is Negative. Fact Sheet for Patients: SugarRoll.be Fact Sheet for Healthcare Providers: https://www.woods-mathews.com/ This test is not yet approved or cleared by the [place] FDA and  has been authorized for detection and/or diagnosis of SARS-CoV-2 by FDA under an Emergency Use Authorization (EUA). This EUA will remain  in effect (meaning this test can be used) for the duration of the COVID-19 declaration under Section 56 4(b)(1) of the Act, 21 U.S.C. section 360bbb-3(b)(1), unless the authorization is terminated or revoked sooner. Performed at [facility], [place] [address], [place], [place] [address]   CSF culture     Status: None   Collection Time: 02/07/19  1:09 PM   Specimen: PATH Cytology CSF; Cerebrospinal Fluid  Result Value Ref Range Status   Specimen  Description CSF  Final   Special Requests NONE  Final   Gram Stain   Final    WBC PRESENT, PREDOMINANTLY PMN NO ORGANISMS SEEN CYTOSPIN SMEAR    Culture   Final    NO GROWTH 3 DAYS Performed at [facility] [address]  [place], [place] [address]    Report Status 02/10/2019 FINAL  Final    Lipid Panel No results for input(s): CHOL, TRIG, HDL, CHOLHDL, VLDL, LDLCALC in the last 72 hours.  Studies/Results: No results found.  Medications:  Scheduled: . amLODipine  10 mg Oral Daily  . doxycycline  100 mg Oral Q12H  . enoxaparin (LOVENOX) injection  40 mg Subcutaneous Q24H  . methadone  120 mg Oral QHS  . pantoprazole  40 mg Oral BID   Continuous: . methylPREDNISolone (SOLU-MEDROL) injection Stopped (02/11/19 1012)  . methylPREDNISolone (SOLU-MEDROL) injection      Assessment: 48 year old female with past medical history significant for methadone abuse, obesity, chronic hypertension, possible alcohol abuse with elevated LFTs admitted for progressive bilateral concentric vision loss, started on the right few months ago and now affecting the left side. Denied any headache or orbital pain. Patient was sent to the emergency department to get MRI brain with contrast as well as MRI orbits.  There is no optic nerve enhancement seen.  Instead there was a partially empty sella and findings suspicious for idiopathic intracranial hypertension.  There was no meningeal enhancement or  enhancement of optic nerves  on MRI brain (however this is been going on for several months and we may no longer enhance). Was evaluated by Dr. Manuella Ghazi of Ophthalmology who saw bilateral papilledema right worse than the left. Opening pressure not significantly high to cause such concentric loss of vision and papilledema and below threshold for diagnosis of IIH, although slightly higher than normal opening pressure of 20 1. LP on 9/19 showed normal WBC, normal protein and elevated RBC c/w traumatic tap. LP had opening pressure of 19.  3. CTA: no LVO, no evidence of CNS vasculitis.  4. RPR negative. HIV screen negative.  5 .Other considerations now include autoimmune disease  - CSF ACE negative.    - CT chest performed to look  for sarcoidosis was  negative.   - ESR and CRP elevated raising suspicion for giant cell although she is less than 50    and denies headache, joint pain, no TA tenderness.   6. Serum ACE level positive - somewhat increases the likelihood of CNS sarcoidosis. However, CT chest was not c/w sarcoidosis.   Recommendations: 1. Recommend the following labs are checked in follow-up with Opthalmology and Rhumatology: Lyme titers, Bartonella titers, Ana,dsdna, rnp, smith, anti scl70, ssa, ssb, antichromatin, jo-1,centromere b, cmv. 2. NMO ab, is less likely to be the case, result currently pending 3. Complete day 5/5 Solu-Medrol  4. Will need close follow up with Ophthalmology for tracking of retinal findings and visual acuity, in addition to Rheumatology follow up to assess for other etiologies for her progressive vision loss with papilledema, which has not responded to pulsed-dose steroid treatment.    LOS: 5 days   _0  signed: Dr. Kerney Elbe 02/12/2019  9:37 AM

## 2019-02-12 NOTE — Progress Notes (Signed)
Family Medicine Teaching Service Daily Progress Note Intern Pager: [phone]  Patient name: Annette Jennings Medical record number: [id number] Date of birth: Apr 13, 1971 Age: 48 y.o. Gender: female  Primary Care Provider: Patient, No Pcp Per Consultants: IR, neurology Code Status: Full  Pt Overview and Major Events to Date:  9/19- Admitted  9/20- Solumedrol started  Assessment and Plan: Annette Jennings is a 48 y.o. female presenting with gradual onset, progressively worsening bilateral vision loss. PMH is significant for elevated blood pressure, previous substance abuse, chronic methadone use.   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 Patient continues to have tunnel vision, which appears to be her new baseline of sight.  Etiology remains unclear thus far, but most likely autoimmune in nature as MRI ruled out neurosarcoidosis and other labs have been nonspecific or within normal limits so far.  Will make an ambulatory referral to rheumatology for further work-up.  Doxycycline started yesterday for possible tickborne infection.  On September 23, patient had lost her IV so she did not get her IV Solu-Medrol dose as scheduled, and IV could not be placed until late in the afternoon.  Her steroid dose was scheduled to be given, however this was never charted.  Therefore she will receive 1 more dose of IV Solu-Medrol today with a  taper of prednisone.  Patient is not a candidate for home health due to insurance. She will follow up with OT as an outpatient, with me as PCP, and with neurology and ophthalmology as well. Plan to discharge today. -Neurology on board, will f/u outpatient -- Will follow up with ophtho, Dr. Manuella Ghazi, outpatient on September 30 at 12:30 PM - LP CSF cx, NG x2d -Follow-up Ana, dsDNA, rnp, smith, anti scl70, ssa, ssb, antichromatin, jo-1,centromere b, bartonella, cmvlabs, NMO ab per neuro/ophtho recs - Ambulatory referral to rheumatology -continue Doxycycline 100 mg  po - Start prednisone taper - OT recommending HH, will consult SW to assist with [facility] setup  Elevated blood pressure: Improving. BP 146/92 this a.m.  Intermittently ranging from systolic [id number] overnight.  Continue hypertensive therapy. - Monitor BP - Continue Amlodipine 10 mg - to follow up with PCP, this provider, in clinic   Previous substance abuse, methadone use: Stable. 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  Daily methadone use through [facility] in [place]. - Dosage verified with clinic through pharmacy team on 9/19 - Methadone 120 mg nightly - ekg for baseline  QTc, manually calculated QTc at 456m, no prolongation  Left adrenal mass on CT: Incidental finding of a 2.6 x 2.1 cm left adrenal mass.  - Recommend CT/MRI outpatient for further evaluation   Elevated alkaline phosphatase: Stable. Alk phos 171 on 9/21, from 187 on admission. Bilirubin, AST/ALT wnl. -GGT elevated to 55 -Consider liver U/S as an outpatient  Tobacco use: Stable. Smokes 1-2 cigarettes daily.  Will hold on nicotine patch for now.  FEN/GI: Regular diet Prophylaxis: Lovenox  Disposition:  Patient is stable, to home, medical work-up can be continued as an outpatient  Subjective:  Still significantly distressed regarding her eyesight loss.  This provider discussed with her at length that we have ruled out any imminently life-threatening causes, and have given her the best treatment in the meantime.  Her eyesight has been declining over the summer, and the rest of the work-up can be completed outpatient.  None of the interventions we will take at this time require hospital level treatment, and she will be best cared for in the  outpatient setting.  Patient agrees and wishes to go home today.  Objective: Temp:  [98 F (36.7 C)-99.1 F (37.3 C)] 98 F (36.7 C) (09/25 1142) Pulse Rate:  [68-93] 72 (09/25 1142) Resp:  [18-19] 19 (09/25 1142) BP: (150-176)/(87-118) 166/93  (09/25 1142) SpO2:  [98 %-100 %] 98 % (09/25 1142) Physical Exam: General: Alert, NAD HEENT: NCAT, MMM, EOMI, PERRLA.  Bilateral visual loss within superior/inferior/peripheral fields to about 15 degrees bilaterally Lungs: Clear bilaterally, no increased WOB  Abdomen: soft, non-tender, non-distended, normoactive BS Neuro: Alert and oriented, no focal neuro deficits on exam Msk: Moves all extremities spontaneously  Ext: Warm, dry, 2+ dorsalis pedis pulses   Laboratory: Recent Labs  Lab 02/09/19 0347 02/11/19 0625 02/12/19 1107  WBC 15.2* 17.1* 16.6*  HGB 13.0 14.0 15.2*  HCT 39.9 40.6 43.9  PLT 284 284 231   Recent Labs  Lab 02/06/19 1327 02/07/19 0411 02/08/19 0642 02/09/19 0347 02/11/19 0625 02/12/19 1107  NA 136 136 136 136 136 135  K 3.5 3.2* 3.8 4.0 4.1 3.4*  CL 100 100 101 100 97* 97*  CO2 '26 26 26 25 28 27  ' BUN '10 8 7 10 19 20  ' CREATININE 0.79 0.71 0.75 0.78 0.96 1.07*  CALCIUM 9.3 9.0 9.5 9.5 9.3 9.6  PROT 8.5* 7.8 8.6*  --   --   --   BILITOT 0.9 0.7 0.6  --   --   --   ALKPHOS 187* 170* 171*  --   --   --   ALT 40 36 38  --   --   --   AST 45* 40 37  --   --   --   GLUCOSE 105* 101* 177* 140* 117* 120*    Imaging/Diagnostic Tests: No results found.  Gladys Damme, MD 02/12/2019, 6:10 PM PGY-1, [place] Intern pager: [phone], text pages welcome

## 2019-02-12 NOTE — Discharge Instructions (Signed)
You were hospitalized at [facility] for worsening vision loss. We did many studies to determine the cause. Although we have not found a clear cause for your vision loss, we did exclude some of the worst case scenarios. It will be very important for you to follow up with neurology and rheumatology for further work up and evaluation. We have placed referrals to both of these specialists, so please look out for a call form them. Please also be sure to follow up with your ophthalmologist in 1 week. We have also scheduled an appointment with our clinic at [facility] to see Dr. Chauncey Reading. Please be sure to attend this appointment as scheduled. Thank you so much for allowing [place] to take care of you.  We have sent you home with a prednisone taper that you will need to take as scheduled until completion. We have also send you home with an antibiotic that we hope may improve your symptoms as well. This antibiotic is called Doxycycline that you will need to take 1 pill twice a day for a month.  Take care, [facility] Family Medicine team

## 2019-02-14 NOTE — Discharge Summary (Signed)
[facility] Discharge Summary  Patient name: Annette Jennings Medical record number: [id number] Date of birth: 1970/05/28 Age: 48 y.o. Gender: female Date of Admission: 02/06/2019  Date of Discharge: 02/11/2019 Admitting Physician: Blane Ohara McDiarmid, MD  Primary Care Provider: Patient, No Pcp Per Consultants: Neurology, IR, ophthalmology  Indication for Hospitalization: Progressive vision loss, optic disc edema  Discharge Diagnoses/Problem List:  Bilateral vision loss with optic disc edema Hypertension Chronic opioid use, stable Left adrenal mass Elevated alk phosphatase Tobacco use disorder  Disposition: To home  Discharge Condition: Stable  Discharge Exam:  General: Alert, NAD HEENT: NCAT, MMM, EOMI, PERRLA. Bilateral visual loss within superior/inferior/peripheral fields to about 15 degrees bilaterally Lungs: Clear bilaterally, no increased WOB  Abdomen: soft, non-tender, non-distended, normoactive BS Neuro: Alert and oriented, no focal neuro deficits on exam Msk: Moves all extremities spontaneously  Ext: Warm, dry, 2+ dorsalis pedis pulses  Brief Hospital Course:  Patient presented to this hospital with 4 months of prior aggressive bilateral vision loss that has increased recently.  She first started noticing diminishment in her fields of vision at the beginning of the summer, when she went to see ophthalmology.  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  She was admitted for concern for neurosarcoidosis, or a neurologic vasculitis.  Work-up within the hospital was largely unrevealing, MRI brain/orbits was negative for neurosarcoidosis, but empty sella and CSF in optic nerves raised possibility of IIH.  LP was obtained, and CSF values were within normal limits, culture with no growth, and opening pressure was normal,  diminishing likelihood of IIH.  Consultation with neurology and ophthalmology, this presentation is most likely due to an autoimmune cause.  CSF ACE negative. Rheumatoid factor positive.  Patient was treated with 6 days of Solu-Medrol 1 g IV, with no sustained improvement.  She was also given doxycycline 100 mg daily x1 month for possible tickborne infection.  Tests for NMO antibody, dsDNA, ANA, Bartonella, Lyme disease antibodies have been performed and awaiting results.  Patient was hypertensive during this admission to 150s over 90s.  Amlodipine 5 mg daily was started.  Her opiate substance use disorder was treated with continue daily methadone.  Her usual dose was confirmed with her clinic is 120 mg of methadone daily.  An elevated alkaline phosphatase of 171 was found on admission, increased to 187 during her hospitalization.  Bilirubin and AST/ALT were within normal limits.  GGT was mildly elevated to 55.  Recommend outpatient follow-up.  An incidental finding of a 2.6 x 2.1 cm left adrenal mass was found on CT.  Recommend CT/MRI outpatient for further evaluation.  Issues for Follow Up:  1. Optic Neuritis: multiple labs pending. Referred to Neurology, rheumatology, and ophthalmology as an outpatient for f/u. 2. Opioid use: Patient has a history of IV heroin use, has been in recovery for 1 year and goes to a methadone clinic; dosage verified with clinic as 120 mg daily.  Recommend monitoring of SUD. 3. HTN: Hypertension noted in the hospital.  Started on amlodipine 5 mg daily. Recommend follow up and titration as needed to achieve goal BP. 4. Adrenal mass: Incidental finding noted on CT.  Recommend outpatient follow-up. 5. Elevated alkaline phosphatase: GGT elevated mildly to 55.  Recommend referring for right upper quadrant ultrasound as an outpatient.  Significant Procedures: LP, MRI  Significant Labs and Imaging:  Recent Labs  Lab 02/09/19 0347 02/11/19 0625 02/12/19 1107  WBC 15.2*  17.1* 16.6*  HGB 13.0 14.0 15.2*  HCT 39.9 40.6 43.9  PLT 284 284 231   Recent Labs  Lab 02/08/19 0642 02/09/19 0347 02/11/19 0625 02/12/19 1107  NA 136 136 136 135  K 3.8 4.0 4.1 3.4*  CL 101 100 97* 97*  CO2 '26 25 28 27  ' GLUCOSE 177* 140* 117* 120*  BUN '7 10 19 20  ' CREATININE 0.75 0.78 0.96 1.07*  CALCIUM 9.5 9.5 9.3 9.6  ALKPHOS 171*  --   --   --   AST 37  --   --   --   ALT 38  --   --   --   ALBUMIN 3.8  --   --   --    MRI brain/orbits IMPRESSION: 1. Essentially negative MRI appearance of the orbits (mildly disconjugate gaze, and CSF in both optic nerve root sleeves, see #3). 2. Highly advanced for age but nonspecific signal abnormality scattered in the bilateral cerebral white matter and deep gray nuclei. Comparatively minor involvement of the brainstem. No associated enhancement or dural thickening, arguing against neurosarcoidosis. Although CSF analysis for Ace level may be valuable. Other differential considerations include accelerated/hereditary small vessel ischemia, sequelae of hypercoagulable state, vasculitis, prior infection or less likely demyelination. 3. Superimposed partially empty sella appearance, along with an effaced appearance of the transverse/sigmoid sinus junctions and capacious optic nerve root sleeves. Consider also idiopathic intracranial hypertension (pseudotumor cerebri).  Results/Tests Pending at Time of Discharge: NMO ab  Discharge Medications:  Allergies as of 02/12/2019      Reactions   Shellfish Allergy Hives, Other (See Comments)   Tightening of chest      Medication List    TAKE these medications   acetaminophen 500 MG tablet Commonly known as: TYLENOL Take 1,000 mg by mouth every 6 (six) hours as needed for headache (pain).   amLODipine 10 MG tablet Commonly known as: NORVASC Take 1 tablet (10 mg total) by mouth daily.   APPLE CIDER VINEGAR PO Take 1 tablet by mouth daily.   doxycycline 100 MG tablet Commonly  known as: VIBRA-TABS Take 1 tablet (100 mg total) by mouth every 12 (twelve) hours.   METHADONE HCL PO Take 120 mg by mouth daily.   multivitamin with minerals Tabs tablet Take 1 tablet by mouth daily.   predniSONE 10 MG tablet Commonly known as: DELTASONE Take 5 tablets (50 mg total) by mouth daily with breakfast for 5 days, THEN 4 tablets (40 mg total) daily with breakfast for 5 days. Start taking on: February 12, 2019   predniSONE 10 MG tablet Commonly known as: DELTASONE Take 3 tablets (30 mg total) by mouth daily with breakfast for 5 days, THEN 2 tablets (20 mg total) daily with breakfast for 5 days. Start taking on: February 12, 2019   predniSONE 10 MG tablet Commonly known as: DELTASONE Take 1 tablet (10 mg total) by mouth daily for 5 days.       Discharge Instructions: Please refer to Patient Instructions section of EMR for full details.  Patient was counseled important signs and symptoms that should prompt return to medical care, changes in medications, dietary instructions, activity restrictions, and follow up appointments.   Follow-Up Appointments: Follow-up Information    Schedule an appointment as soon as possible for a visit with Sater, Nanine Means, MD.   Specialty: Neurology Why: for further evaluation Contact information: [place]  [place]        Danice Goltz, MD. Go on 02/17/2019.   Specialty: Ophthalmology Why: Appointment at 12:30 Contact information: [place] [address] [place] Follow up.   Specialty: Rehabilitation Why: The outpatient rehab will contact you for the first appointment Contact information: [place]       Gladys Damme, MD. Go on 02/19/2019.   Specialty: Family Medicine Why: 3:15 PM appt Contact information: [address] [place] [address] [phone]           Gladys Damme, MD 02/14/2019, 4:34 PM PGY-1, [place]

## 2019-02-17 LAB — MISC LABCORP TEST (SEND OUT): Labcorp test code: 9985

## 2019-02-19 ENCOUNTER — Other Ambulatory Visit: Payer: Self-pay

## 2019-02-19 ENCOUNTER — Ambulatory Visit (INDEPENDENT_AMBULATORY_CARE_PROVIDER_SITE_OTHER): Payer: Self-pay | Admitting: Family Medicine

## 2019-02-19 ENCOUNTER — Encounter: Payer: Self-pay | Admitting: Family Medicine

## 2019-02-19 VITALS — BP 144/92 | HR 125 | Wt 185.2 lb

## 2019-02-19 DIAGNOSIS — H543 Unqualified visual loss, both eyes: Secondary | ICD-10-CM

## 2019-02-19 DIAGNOSIS — E278 Other specified disorders of adrenal gland: Secondary | ICD-10-CM

## 2019-02-19 DIAGNOSIS — F1190 Opioid use, unspecified, uncomplicated: Secondary | ICD-10-CM

## 2019-02-19 DIAGNOSIS — F1120 Opioid dependence, uncomplicated: Secondary | ICD-10-CM

## 2019-02-19 DIAGNOSIS — Z9189 Other specified personal risk factors, not elsewhere classified: Secondary | ICD-10-CM

## 2019-02-19 DIAGNOSIS — R748 Abnormal levels of other serum enzymes: Secondary | ICD-10-CM

## 2019-02-19 DIAGNOSIS — I10 Essential (primary) hypertension: Secondary | ICD-10-CM

## 2019-02-19 DIAGNOSIS — Z0001 Encounter for general adult medical examination with abnormal findings: Secondary | ICD-10-CM

## 2019-02-19 DIAGNOSIS — B182 Chronic viral hepatitis C: Secondary | ICD-10-CM | POA: Insufficient documentation

## 2019-02-19 DIAGNOSIS — Z1159 Encounter for screening for other viral diseases: Secondary | ICD-10-CM

## 2019-02-19 DIAGNOSIS — R000 Tachycardia, unspecified: Secondary | ICD-10-CM | POA: Insufficient documentation

## 2019-02-19 MED ORDER — LOSARTAN POTASSIUM 25 MG PO TABS: 25.0000 mg | ORAL_TABLET | Freq: Every day | ORAL | 0 refills | Status: DC

## 2019-02-19 NOTE — Assessment & Plan Note (Signed)
Patient had heart rate of 125 during vitals.  She states that she has not had any water to drink today, only coffee.  She is also on steroids for the last 2 weeks.  She is completely asymptomatic.  -Gave patient a glass of water, heart rate subsequently 115 -Recommended patient hydrate better -Gave strict return precautions to patient: if she becomes dizzy, lightheaded, short of breath, has chest pain, becomes sweaty, has nausea/vomiting, etc, told her to be evaluated immediately in the emergency department -Follow-up in 2 weeks

## 2019-02-19 NOTE — Patient Instructions (Signed)
It was a pleasure seeing you again today!  1. Please continue to take the doxycycline for a total of 1 month. I recommend you finish the taper of the prednisone (steroid) rather than stopping suddenly.  2. Follow up with Dr. Manuella Ghazi about your vision and test results. Also see Neurology and Rheumatology when you get a chance.  3. Congratulations on almost 1 year clean! Keep up the good work :)  4. I will make a referral to our social worker about maximizing your insurance coverage.  5. Please start taking Cozaar (losartan) 25 mg once daily to help your blood pressure and protect your kidneys.  6. Follow up with the scheduling regarding the imaging for your adrenal glands.  7. Follow up with me in 1 month to check in on your progress.  Be Well!  Dr. Gladys Damme

## 2019-02-19 NOTE — Assessment & Plan Note (Signed)
Blood pressure 144/92 today.  Close to goal, patient is still technically on prednisone taper, has been on steroids for 2 weeks overall -Will start an ARB, losartan 25 mg daily, to reach blood pressure target, will follow-up in 2 weeks -Patient has history in her mother of death secondary to ESRD, renal failure

## 2019-02-19 NOTE — Assessment & Plan Note (Signed)
Patient has mild improvement in her peripheral vision bilaterally -Encouraged patient to continue prednisone taper, she will complete doxycycline after a total of 1 month of therapy -Encouraged patient to attend follow-ups, especially with Dr. Brigitte Pulse in ophthalmology -NMO antibody was negative, making neuro myelitis optica a less likely diagnosis.  However, and negative, one-time test does not conclusively rule out NMO.  Though, her diagnosis is less likely to be NML as the sample was obtained when she was having acute optic neuritis, and was obtained the same day that IV Solu-Medrol was started, but most likely before the IV Solu-Medrol was begun (it began at 10:00, most likely this lab was collected earlier in the day). -Patient to follow-up with neurology and rheumatology outpatient

## 2019-02-19 NOTE — Assessment & Plan Note (Signed)
2 cm x 2 cm adrenal mass found incidentally on CT chest during hospital admission. -Radiology recommended outpatient CT for further work-up, will refer patient for this imaging

## 2019-02-19 NOTE — Assessment & Plan Note (Signed)
Patient had elevated alkaline phosphatase in the hospital to 171, initial is 187.  AST/ALT, bilirubin were within normal limits. -We will repeat LFTs today -Obtain hepatitis C antibody, as patient is high risk for hepatitis C 2/2 H/O IVDU -Will refer for right upper quadrant ultrasound depending upon results of these 2 tests.

## 2019-02-19 NOTE — Progress Notes (Signed)
Subjective:    Annette Jennings ID: Annette Jennings, female    DOB: [birth year], 48 y.o.   MRN: [id number]   CC: Hospital follow-up  HPI: Vision Loss: Annette Jennings presents today for hospital follow-up.  She states that 2 days ago she had some nausea and vomiting after eating a sandwich.  She thought it might be due to the prednisone, of note she has been on prednisone for 2 weeks now.  She states that she stopped taking prednisone cold [place] 2 days ago.  She is having no problems taking the doxycycline.  She has had some mild improvement in her vision, says that she can see somewhat peripherally on the left side as well now (still fuzzy).  Specialty follow up: She was supposed to follow-up with Dr. Manuella Ghazi (ophthalmology) on September 30, states that she forgot about this appointment has not been to see Dr. Brigitte Pulse yet.  Methadone clinic: Annette Jennings is continuing well in her recovery, states that she is coming up on her 1 year anniversary.  She has not used any substances other than her methadone as prescribed.  Elev. Alk phos: She denies any jaundice, itching, right upper quadrant pain.  She denies consuming any alcohol at this time.  Previously she has used alcohol and intravenous drugs.   Adrenal gland: Annette Jennings reports no problems with sleep, no episodes of syncope, dizziness, sweating.  Tachycardia: Annette Jennings tachycardic to 125 on vitals.  States that she is not had any water today, only had coffee.  She is asymptomatic, no chest pain, no dizziness, no fainting, no nausea/vomiting, no diaphoresis  Smoking status reviewed   ROS: pertinent noted in the HPI   Past medical history, surgical, family, and social history reviewed and updated in the EMR as appropriate.  Objective:  BP (!) 144/92   Pulse (!) 125   Wt 185 lb 3.2 oz (84 kg)   SpO2 95%   BMI 29.01 kg/m   Vitals and nursing note reviewed  General: NAD, pleasant, able to participate in exam Cardiac: RRR, S1 S2 present. normal heart sounds,  no murmurs. Respiratory: CTAB, normal effort, No wheezes, rales or rhonchi Extremities: no edema or cyanosis. Skin: warm and dry, no rashes noted Neuro:  CN II: PERRL CN III, IV,VI: EOMI CV V: Normal sensation in V1, V2, V3 CVII: symmetric smile, able to raise eyebrows, puff out cheeks CN VIII: Appears to have normal hearing CN IX,X: Symmetric palate raise  Able to move both upper extremities bilaterally. Shoulder shrug strength 5/5, Tongue protrudes to midline Psych: Normal affect and mood   Assessment & Plan:  Annette Jennings is a 48 year old woman with bilateral, progressive vision loss, hypertension, substance use disorder-in remission, elevated alk phos, and incidental finding of an adrenal mass.  Vision loss, bilateral Annette Jennings has mild improvement in her peripheral vision bilaterally -Encouraged Annette Jennings to continue prednisone taper, she will complete doxycycline after a total of 1 month of therapy -Encouraged Annette Jennings to attend follow-ups, especially with Dr. Brigitte Pulse in ophthalmology -NMO antibody was negative, making neuro myelitis optica a less likely diagnosis.  However, and negative, one-time test does not conclusively rule out NMO.  Though, her diagnosis is less likely to be NML as the sample was obtained when she was having acute optic neuritis, and was obtained the same day that IV Solu-Medrol was started, but most likely before the IV Solu-Medrol was begun (it began at 10:00, most likely this lab was collected earlier in the day). -Annette Jennings to follow-up with neurology and rheumatology outpatient  Methadone use [facility]) Annette Jennings states that her recovery is going well.  She is taking methadone as prescribed, coming up on her 1 year anniversary of recovery.  Essential hypertension Blood pressure 144/92 today.  Close to goal, Annette Jennings is still technically on prednisone taper, has been on steroids for 2 weeks overall -Will start an ARB, losartan 25 mg daily, to reach blood pressure target,  will follow-up in 2 weeks -Annette Jennings has history in her mother of death secondary to ESRD, renal failure  Adrenal mass 1 cm to 4 cm in diameter with no history of malignant neoplasm (HCC) 2 cm x 2 cm adrenal mass found incidentally on CT chest during hospital admission. -Radiology recommended outpatient CT for further work-up, will refer Annette Jennings for this imaging  Elevated alkaline phosphatase level Annette Jennings had elevated alkaline phosphatase in the hospital to 171, initial is 187.  AST/ALT, bilirubin were within normal limits. -We will repeat LFTs today -Obtain hepatitis C antibody, as Annette Jennings is high risk for hepatitis C 2/2 H/O IVDU -Will refer for right upper quadrant ultrasound depending upon results of these 2 tests.  Heart rate fast Annette Jennings had heart rate of 125 during vitals.  She states that she has not had any water to drink today, only coffee.  She is also on steroids for the last 2 weeks.  She is completely asymptomatic.  -Gave Annette Jennings a glass of water, heart rate subsequently 115 -Recommended Annette Jennings hydrate better -Gave strict return precautions to Annette Jennings: if she becomes dizzy, lightheaded, short of breath, has chest pain, becomes sweaty, has nausea/vomiting, etc, told her to be evaluated immediately in the emergency department -Follow-up in 2 weeks   Gladys Damme, MD [place] PGY-1

## 2019-02-19 NOTE — Assessment & Plan Note (Signed)
Patient states that her recovery is going well.  She is taking methadone as prescribed, coming up on her 1 year anniversary of recovery.

## 2019-02-20 LAB — HEPATIC FUNCTION PANEL
ALT: 59 IU/L — ABNORMAL HIGH (ref 0–32)
AST: 40 IU/L (ref 0–40)
Albumin: 4.5 g/dL (ref 3.8–4.8)
Alkaline Phosphatase: 213 IU/L — ABNORMAL HIGH (ref 39–117)
Bilirubin Total: 0.3 mg/dL (ref 0.0–1.2)
Bilirubin, Direct: 0.11 mg/dL (ref 0.00–0.40)
Total Protein: 7.9 g/dL (ref 6.0–8.5)

## 2019-02-20 LAB — HEPATITIS C ANTIBODY: Hep C Virus Ab: >11 s/co ratio — ABNORMAL HIGH (ref 0.0–0.9)

## 2019-02-24 NOTE — Progress Notes (Signed)
Received report from [facility], and NMO antibody was negative. Fax with result scanned to patient's chart.

## 2019-02-24 NOTE — Progress Notes (Signed)
Discussed with patient the positive antibody result for hepatitis C.  Will perform viral load test at her next appointment on October 19, and refer her to [facility] for evaluation.

## 2019-03-03 ENCOUNTER — Other Ambulatory Visit: Payer: Self-pay

## 2019-03-03 ENCOUNTER — Inpatient Hospital Stay (INDEPENDENT_AMBULATORY_CARE_PROVIDER_SITE_OTHER): Payer: Medicaid Other | Admitting: Primary Care

## 2019-03-08 ENCOUNTER — Ambulatory Visit (INDEPENDENT_AMBULATORY_CARE_PROVIDER_SITE_OTHER): Payer: Self-pay | Admitting: Family Medicine

## 2019-03-08 ENCOUNTER — Encounter: Payer: Self-pay | Admitting: Family Medicine

## 2019-03-08 ENCOUNTER — Other Ambulatory Visit: Payer: Self-pay

## 2019-03-08 VITALS — BP 130/80 | HR 80 | Wt 192.2 lb

## 2019-03-08 DIAGNOSIS — R768 Other specified abnormal immunological findings in serum: Secondary | ICD-10-CM

## 2019-03-08 DIAGNOSIS — G8929 Other chronic pain: Secondary | ICD-10-CM

## 2019-03-08 DIAGNOSIS — M545 Low back pain, unspecified: Secondary | ICD-10-CM

## 2019-03-08 DIAGNOSIS — H543 Unqualified visual loss, both eyes: Secondary | ICD-10-CM

## 2019-03-08 DIAGNOSIS — I10 Essential (primary) hypertension: Secondary | ICD-10-CM

## 2019-03-08 MED ORDER — PREDNISONE 10 MG PO TABS: 10.0000 mg | ORAL_TABLET | Freq: Every day | ORAL | 0 refills | Status: DC

## 2019-03-08 NOTE — Patient Instructions (Signed)
It was a pleasure seeing you today! Congratulations on your anniversary, you've worked hard so enjoy it :)  1. I've prescribed 1 week of low-dose prednisone for your back and your eyesight, let's see if this helps give you some relief.  2. Doctors to contact for visits:  - Neurology= "nerve"ology -- follow up re: eyesight and back pain - Rheumatology= autoimmune disease specialists - Ophthalmology= Dr. Manuella Ghazi, eye specialist  3. I collected a test to see if you have an active Hepatitis C infection. If you do, I will refer you to the Infectious Disease specialist who can treat and cure Hep C infection.  4. Due to your back pain, imaging would be useful to evaluate the cause. I have referred you for an x-ray of your spine, which you can get on any week day between 8AM and 5PM.  5. I will refer you to our social worker who may be able to help you navigate improved insurance.

## 2019-03-08 NOTE — Progress Notes (Signed)
Subjective:    Patient ID: Annette Jennings, female    DOB: 04/25/1971, 48 y.o.   MRN: [id number]   CC: follow up  HPI: Patient presents today for follow-up from last appointment, will obtain HCV RNA quant with reflex for genetic testing today due to positive HCV antibody testing at last visit.  Patient states that her eyesight has remained unchanged since last visit, she still has tunnel vision.  She also notes that she has increased back pain since discontinuing the prednisone.  Patient had previously had this low back pain, but did not mention it due to the concern for severity of her vision loss.  She notes that the back pain was gone while she was in the hospital and being treated with IV Solu-Medrol and subsequently with oral prednisone.  She describes the pain as a dull ache above her coccyx and sacrum.  She can perform normal activities for about 15 minutes, and then the pain becomes too intense that she has to sit down and rest until she feels relief.  She notices this pain with normal activities such as doing her hair, sweeping, walking upstairs.  It prevents her from completing these activities as normal.  She denies any incontinence or saddle anesthesia.  She has no referred pain, or tingling in her legs.  She has now been off prednisone for more than 2 weeks.  She states that she has not seen her ophthalmologist, and is confused about which doctor she needs to make appointments with to follow-up.  Her 1 year anniversary of recovery is in 2 days.  Tachycardia is resolved today from last visit.  Smoking status reviewed   ROS: pertinent noted in the HPI   Past medical history, surgical, family, and social history reviewed and updated in the EMR as appropriate.  Objective:  BP 130/80   Pulse 80   Wt 192 lb 3.2 oz (87.2 kg)   SpO2 100%   BMI 30.10 kg/m   Vitals and nursing note reviewed  General: NAD, pleasant, able to participate in exam Cardiac: RRR, S1 S2 present.  normal heart sounds, no murmurs. Respiratory: CTAB, normal effort, No wheezes, rales or rhonchi Extremities: no edema or cyanosis. MSK: paraspinal muscles equal tone bilaterally, no tenderness to palpation along spine, coccyx, or SI joints, or in paraspinal muscles. Skin: warm and dry, no rashes noted Neuro: alert, no obvious focal deficits. Strength 5/5 in bilateral lower extremities, DTRs intact, sensation intact in bilateral LEs, ROM intact in bilateral LEs, SLR negative bilaterally.  Psych: Normal affect, patient is a little down about lack of improvement with eyesight and no diagnosis as of yet, looking forward to her anniversary   Assessment & Plan:  Ms. Norwick is a 48 yo woman with bilateral vision loss, low back pain, HTN, and positive HCV ab.  HCV antibody positive Will obtain HCV RNA quant and reflex for genome testing. If positive, will refer pt to [facility] for evaluation and treatment.  Vision loss, bilateral Discussed with pt the importance of following up with specialty physicians for further evaluation of her vision loss, possibly back pain could be related as it started around the time of her vision loss and improved with steroids. This raises NMO as a potential dx. Previously NMO ab was negative, but testing was same day as she received IV solumedrol for the first time, does not exclude the dx. - Pt to follow up with Dr Manuella Ghazi (ophthalmology), Dr. Felecia Shelling (neurology) : schedule appts - Pt has appt  with Rheumatology on 05/10/19 - Since she only has family planning medicaid and new onset of vision loss and disability related to vision loss, will refer her to SW to evaluate for options for increased access to insurance related to disability  Back pain Back pain could be related to vision loss as the timing was the same and it improved with steroid treatment. She has no indications of sciatic disease or musculoligamentous pain on exam. Discussed return precautions if red flag sx develop  (saddle anesthesia, incontinence, paresthesia). - Will obtain spine x-ray when pt able  - Start low-dose steroid for tx of vision loss as well for 1 week, prednisone 10 mg qd x 7d - To rheumatology in Dec - F/U with me in 1 month  Essential hypertension Repeat BP 130/80, very close to goal, will continue to monitor.  Gladys Damme, MD [place] PGY-1

## 2019-03-10 ENCOUNTER — Encounter: Payer: Self-pay | Admitting: Family Medicine

## 2019-03-10 ENCOUNTER — Telehealth: Payer: Self-pay | Admitting: Family Medicine

## 2019-03-10 ENCOUNTER — Telehealth: Payer: Self-pay | Admitting: Licensed Clinical Social Worker

## 2019-03-10 DIAGNOSIS — K732 Chronic active hepatitis, not elsewhere classified: Secondary | ICD-10-CM

## 2019-03-10 DIAGNOSIS — M549 Dorsalgia, unspecified: Secondary | ICD-10-CM | POA: Insufficient documentation

## 2019-03-10 NOTE — Assessment & Plan Note (Signed)
Discussed with pt the importance of following up with specialty physicians for further evaluation of her vision loss, possibly back pain could be related as it started around the time of her vision loss and improved with steroids. This raises NMO as a potential dx. Previously NMO ab was negative, but testing was same day as she received IV solumedrol for the first time, does not exclude the dx. - Pt to follow up with Dr Manuella Ghazi (ophthalmology), Dr. Felecia Shelling (neurology) : schedule appts - Pt has appt with Rheumatology on 05/10/19 - Since she only has family planning medicaid and new onset of vision loss and disability related to vision loss, will refer her to SW to evaluate for options for increased access to insurance related to disability

## 2019-03-10 NOTE — Assessment & Plan Note (Signed)
Repeat BP 130/80, very close to goal, will continue to monitor.

## 2019-03-10 NOTE — Progress Notes (Signed)
Will refer pt to [facility] for evaluation and tx of Hep C. Pt notified.

## 2019-03-10 NOTE — Assessment & Plan Note (Addendum)
Back pain could be related to vision loss as the timing was the same and it improved with steroid treatment. She has no indications of sciatic disease or musculoligamentous pain on exam. Discussed return precautions if red flag sx develop (saddle anesthesia, incontinence, paresthesia). - Will obtain spine x-ray when pt able  - Start low-dose steroid for tx of vision loss as well for 1 week, prednisone 10 mg qd x 7d - To rheumatology in Dec - F/U with me in 1 month

## 2019-03-10 NOTE — Assessment & Plan Note (Signed)
Will obtain HCV RNA quant and reflex for genome testing. If positive, will refer pt to [facility] for evaluation and treatment.

## 2019-03-10 NOTE — Telephone Encounter (Signed)
   Care Coordination Phone outreach Note Social Work   03/10/2019 Name: Abril Needler MRN: [id number] DOB: 1970-11-14  Burnell Hartl is a 48 y.o. year old female who sees Dr. Chauncey Reading for primary care. LCSW was consulted by Dr. Chauncey Reading with concerns of patient not having insurance to cover medial needs. Called patient to assess needs, barriers and provide information. Patient only had a few min. To talk.  LCSW offered to to allow patient to schedule the phone call at a time that was good for her, she declined. Assessment : Patient is currently receiving family planning Medicaid which will not cover her medical needs.  She has not applied for insurance via the [place].  Recommendation:Patient may benefit from and is in agreement to review options discussed Interventions: Reviewed the 3 options below with patient and will also mail a copy.  1. [email or web address]   Call [phone] 2. [place] [facility], [email or web address]   [phone]  3. [employer]- Pick up application at the front desk  Plan:  1. Patient will review information mailed and will call LCSW if she has questions 2. No further follow up required: by LCSW   Dr. Chauncey Reading has been informed of this outreach and plan.  Casimer Lanius, LCSW Clinical Social Worker [place] [facility]   [phone] 3:22 PM

## 2019-03-10 NOTE — Telephone Encounter (Signed)
Notified pt of active Hep C infection. Will refer her to [facility] as previously discussed.  Gladys Damme, MD

## 2019-03-11 LAB — HEPATITIS C GENOTYPE

## 2019-03-11 LAB — HCV RNA QUANT RFLX ULTRA OR GENOTYP
HCV Quant Baseline: 1910000 IU/mL
HCV log10: 6.281 log10 IU/mL

## 2019-03-13 ENCOUNTER — Other Ambulatory Visit: Payer: Self-pay | Admitting: Family Medicine

## 2019-03-14 ENCOUNTER — Other Ambulatory Visit: Payer: Self-pay | Admitting: Family Medicine

## 2019-03-14 DIAGNOSIS — I10 Essential (primary) hypertension: Secondary | ICD-10-CM

## 2019-03-16 ENCOUNTER — Telehealth: Payer: Self-pay

## 2019-03-16 NOTE — Telephone Encounter (Signed)
COVID-19 Pre-Screening Questions:  Do you currently have a fever (>100 F), chills or unexplained body aches? NO   Are you currently experiencing new cough, shortness of breath, sore throat, runny nose?NO .  Have you recently travelled outside the state of [place] in the last 14 days? NO .  Have you been in contact with someone that is currently pending confirmation of Covid19 testing or has been confirmed to have the Covid19 virus?  NO  **If the patient answers NO to ALL questions -  advise the patient to please call the clinic before coming to the office should any symptoms develop.     

## 2019-03-17 ENCOUNTER — Telehealth: Payer: Self-pay | Admitting: Pharmacy Technician

## 2019-03-17 ENCOUNTER — Other Ambulatory Visit: Payer: Self-pay

## 2019-03-17 ENCOUNTER — Ambulatory Visit (INDEPENDENT_AMBULATORY_CARE_PROVIDER_SITE_OTHER): Payer: Self-pay | Admitting: Family

## 2019-03-17 ENCOUNTER — Encounter: Payer: Self-pay | Admitting: Family

## 2019-03-17 VITALS — BP 135/89 | HR 102 | Temp 98.6°F | Wt 192.0 lb

## 2019-03-17 DIAGNOSIS — B182 Chronic viral hepatitis C: Secondary | ICD-10-CM

## 2019-03-17 NOTE — Patient Instructions (Signed)
Nice to meet you.  We will get your prescription submitted to the pharmacy.   Limit acetaminophen (Tylenol) usage to no more than 2 grams (2,000 mg) per day.  Avoid alcohol.  Do not share toothbrushes or razors.  Practice safe sex to protect against transmission as well as sexually transmitted disease.    Hepatitis C Hepatitis C is a viral infection of the liver. It can lead to scarring of the liver (cirrhosis), liver failure, or liver cancer. Hepatitis C may go undetected for months or years because people with the infection may not have symptoms, or they may have only mild symptoms. What are the causes? This condition is caused by the hepatitis C virus (HCV). The virus can spread from person to person (is contagious) through:  Blood.  Childbirth. A woman who has hepatitis C can pass it to her baby during birth.  Bodily fluids, such as breast milk, tears, semen, vaginal fluids, and saliva.  Blood transfusions or organ transplants done in the [place] before 1992.  What increases the risk? The following factors may make you more likely to develop this condition:  Having contact with unclean (contaminated) needles or syringes. This may result from: ? Acupuncture. ? Tattoing. ? Body piercing. ? Injecting drugs.  Having unprotected sex with someone who is infected.  Needing treatment to filter your blood (kidney dialysis).  Having HIV (human immunodeficiency virus) or AIDS (acquired immunodeficiency syndrome).  Working in a job that involves contact with blood or bodily fluids, such as health care.  What are the signs or symptoms? Symptoms of this condition include:  Fatigue.  Loss of appetite.  Nausea.  Vomiting.  Abdominal pain.  Dark yellow urine.  Yellowish skin and eyes (jaundice).  Itchy skin.  Clay-colored bowel movements.  Joint pain.  Bleeding and bruising easily.  Fluid building up in your stomach (ascites).  In some cases, you may not  have any symptoms. How is this diagnosed? This condition is diagnosed with:  Blood tests.  Other tests to check how well your liver is functioning. They may include: ? Magnetic resonance elastography (MRE). This imaging test uses MRIs and sound waves to measure liver stiffness. ? Transient elastography. This imaging test uses ultrasounds to measure liver stiffness. ? Liver biopsy. This test requires taking a small tissue sample from your liver to examine it under a microscope.  How is this treated? Your health care provider may perform noninvasive tests or a liver biopsy to help decide the best course of treatment. Treatment may include:  Antiviral medicines and other medicines.  Follow-up treatments every 6-12 months for infections or other liver conditions.  Receiving a donated liver (liver transplant).  Follow these instructions at home: Medicines  Take over-the-counter and prescription medicines only as told by your health care provider.  Take your antiviral medicine as told by your health care provider. Do not stop taking the antiviral even if you start to feel better.  Do not take any medicines unless approved by your health care provider, including over-the-counter medicines and birth control pills. Activity  Rest as needed.  Do not have sex unless approved by your health care provider.  Ask your health care provider when you may return to school or work. Eating and drinking  Eat a balanced diet with plenty of fruits and vegetables, whole grains, and lowfat (lean) meats or non-meat proteins (such as beans or tofu).  Drink enough fluids to keep your urine clear or pale yellow.  Do not drink  alcohol. General instructions  Do not share toothbrushes, nail clippers, or razors.  Wash your hands frequently with soap and water. If soap and water are not available, use hand sanitizer.  Cover any cuts or open sores on your skin to prevent spreading the virus.  Keep all  follow-up visits as told by your health care provider. This is important. You may need follow-up visits every 6-12 months. How is this prevented? There is no vaccine for hepatitis C. The only way to prevent the disease is to reduce the risk of exposure to the virus. Make sure you:  Wash your hands frequently with soap and water. If soap and water are not available, use hand sanitizer.  Do not share needles or syringes.  Practice safe sex and use condoms.  Avoid handling blood or bodily fluids without gloves or other protection.  Avoid getting tattoos or piercings in shops or other locations that are not clean.  Contact a health care provider if:  You have a fever.  You develop abdominal pain.  You pass dark urine.  You pass clay-colored stools.  You develop joint pain. Get help right away if:  You have increasing fatigue or weakness.  You lose your appetite.  You cannot eat or drink without vomiting.  You develop jaundice or your jaundice gets worse.  You bruise or bleed easily. Summary  Hepatitis C is a viral infection of the liver. It can lead to scarring of the liver (cirrhosis), liver failure, or liver cancer.  The hepatitis C virus (HCV) causes this condition. The virus can pass from person to person (is contagious).  You should not take any medicines unless approved by your health care provider. This includes over-the-counter medicines and birth control pills. This information is not intended to replace advice given to you by your health care provider. Make sure you discuss any questions you have with your health care provider. Document Released: 05/03/2000 Document Revised: 06/11/2016 Document Reviewed: 06/11/2016 Elsevier Interactive Patient Education  2018 [place]; pibrentasvir oral tablets What is this medicine? GLECAPREVIR; PIBRENTASVIR (glek a' pre vir; pi brent' as vir) is an antiviral medicine used to treat hepatitis C. It will not  work for colds, flu, or other viral infections. This medicine may be used for other purposes; ask your health care provider or pharmacist if you have questions. COMMON BRAND NAME(S): Mavyret What should I tell my health care provider before I take this medicine? They need to know if you have any of these conditions:  diabetes  HIV or AIDS  other liver disease  an unusual or allergic reaction to glecaprevir, pibrentasvir, other medicines, foods, dyes, or preservatives  pregnant or trying to get pregnant  breast-feeding How should I use this medicine? Take this medicine by mouth with a full glass of water. Follow the directions on the prescription label. Take this medicine with food. Take your medicine at regular intervals. Do not take your medicine more often than directed. Take all of your medicine as directed even if you think you are better. Do not skip doses or stop your medicine early. Talk to your pediatrician regarding the use of this medicine in children. While this drug may be prescribed for selected conditions, precautions do apply. Overdosage: If you think you have taken too much of this medicine contact a poison control center or emergency room at once. NOTE: This medicine is only for you. Do not share this medicine with others. What if I miss a dose?  If you miss a dose, take it as soon as you can. If your next dose is to be taken in less than 6 hours, then do not take the missed dose. Take the next dose at your regular time. Do not take double or extra doses. What may interact with this medicine? Do not take this medicine with any of the following medications:  atazanavir  rifampin This medicine may also interact with the following medications:  birth control pills  carbamazepine  certain medicines for cholesterol like atorvastatin, fluvastatin, lovastatin, pitavastatin, pravastatin, rosuvastatin, simvastatin  certain medicines for diabetes  cyclosporine  digoxin   dabigatran  darunavir  efavirenz  lopinavir  ritonavir  St. John's wort  warfarin This list may not describe all possible interactions. Give your health care provider a list of all the medicines, herbs, non-prescription drugs, or dietary supplements you use. Also tell them if you smoke, drink alcohol, or use illegal drugs. Some items may interact with your medicine. What should I watch for while using this medicine? See your doctor or health care professional for a follow-up visit as directed. You may need blood work while you are taking this medicine. Tell your doctor if your symptoms do not improve or if they get worse. If you have had hepatitis B infection (HBV) in the past, taking this medicine could cause the HBV to become active again. If you have had HBV, your doctor should monitor you with blood tests. Tell your doctor right away if you develop a general ill feeling, light-colored stools, loss of appetite, unusual weakness, or yellowing of the eyes or skin. This medicine may cause changes in your blood sugar. Ask your health care provider if changes in diet or medicines are needed if you have diabetes. What side effects may I notice from receiving this medicine? Side effects that you should report to your doctor or health care professional as soon as possible:  allergic reactions like skin rash, itching or hives, swelling of the face, lips, or tongue  signs and symptoms of liver injury like dark yellow or brown urine; general ill feeling or flu-like symptoms; light-colored stools; loss of appetite; nausea; vomiting; right upper belly pain; unusually weak or tired; yellowing of the eyes or skin Side effects that usually do not require medical attention (report these to your doctor or health care professional if they continue or are bothersome):  diarrhea  headache  nausea  tiredness This list may not describe all possible side effects. Call your doctor for medical advice  about side effects. You may report side effects to FDA at 1-800-FDA-1088. Where should I keep my medicine? Keep out of the reach of children. Store at room temperature below 30 degrees C (86 degrees F). Throw away any unused medicine after the expiration date. NOTE: This sheet is a summary. It may not cover all possible information. If you have questions about this medicine, talk to your doctor, pharmacist, or health care provider.  2020 Elsevier/Gold Standard (2018-07-14 10:32:50)

## 2019-03-17 NOTE — Assessment & Plan Note (Signed)
[name] is a 48 y/o female with Genotype 1a Chronic Hepatitis C with viral load of 1.91 million. She appears to be at low risk for liver fibrosis with APRI score of 0.431 and FIB-4 of 1.10. We discussed the pathophysiology, transmission, risks if left untreated and treatment options for Hepatitis C with questions answered. Check Hepatitis B surface antigen and antibody. Plan of care will be treatment with Mavyret x 8 weeks with monitoring at 1 month after start of medication, 8 weeks at completion of medication, and 3 months following treatment for confirmation of SVR.

## 2019-03-17 NOTE — Telephone Encounter (Signed)
[facility] Patient Teacher, English as a foreign language completed.    The patient is [place] ([place] medications[place] and will need patient assistance for medication.  We can complete the application and will need to meet with the patient for signatures and income documentation.  Annette Jennings. Annette Jennings Patient [facility] for Infectious Disease Phone: [phone] Fax:  [phone]

## 2019-03-17 NOTE — Telephone Encounter (Addendum)
[facility] Patient Advocate Encounter  Completed and sent MyAbbvie application for Mavyret for this patient who is uninsured.    She has been approved.  The medication will be shipped to the clinic for her to pick up here, per her request.  Inez Catalina E. Nadara Mustard [place] Patient [facility] for Infectious Disease Phone: [phone] Fax:  [phone]

## 2019-03-17 NOTE — Progress Notes (Signed)
Subjective:    Patient ID: Annette Jennings, female    DOB: Feb 10, 1971, 48 y.o.   MRN: [id number]  Chief Complaint  Patient presents with  . Hepatitis C    HPI:  Annette Jennings is a 48 y.o. female with previous medical history of hypertension, back pain, and methadone use presenting today for evaluation and treatment of Hepatitis C.   Annette Jennings was initially diagnosed with Hepatitis C about 1 week ago when she was seen in her primary care office during a preventative screening with testing resulting in a positive Hepatitis C antibody test. Follow up testing results reviewed with Genotype 1a; viral load of 1.91 million; AST 40 / ALT 59; and platelets of 231. 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 Denies any blood transfusions prior to 1992, sharing of razors/toothbrushes, receiving a tattoo at an unlicensed place, or sexual contact with a known positive partner. No personal or family history of liver disease. Treatment naive and asymptomatic. No current recreational or illicit drug use or alcohol consumption. Currently smoking about 2 cigarettes per day on average. Hepatitis B status is currently unknown.    Allergies  Allergen Reactions  . Shellfish Allergy Hives and Other (See Comments)    Tightening of chest      Outpatient Medications Prior to Visit  Medication Sig Dispense Refill  . acetaminophen (TYLENOL) 500 MG tablet Take 1,000 mg by mouth every 6 (six) hours as needed for headache (pain).    Marland Kitchen amLODipine (NORVASC) 10 MG tablet Take 1 tablet (10 mg total) by mouth daily. 30 tablet 2  . APPLE CIDER VINEGAR PO Take 1 tablet by mouth daily.    Marland Kitchen doxycycline (VIBRA-TABS) 100 MG tablet Take 1 tablet (100 mg total) by mouth every 12 (twelve) hours. 60 tablet 0  . losartan (COZAAR) 25 MG tablet Take 1 tablet (25 mg total) by mouth daily. 30  tablet 0  . METHADONE HCL PO Take 120 mg by mouth daily.    . Multiple Vitamin (MULTIVITAMIN WITH MINERALS) TABS tablet Take 1 tablet by mouth daily.    . predniSONE (DELTASONE) 10 MG tablet Take 1 tablet (10 mg total) by mouth daily with breakfast. 7 tablet 0   No facility-administered medications prior to visit.      Past Medical History:  Diagnosis Date  . Hepatitis C   . Hypertension       Past Surgical History:  Procedure Laterality Date  . HYSTEROTOMY        Family History  Problem Relation Age of Onset  . Kidney disease Mother   . Diabetes Father   . Hypertension Father       Social History   Socioeconomic History  . Marital status: Single    Spouse name: Not on file  . Number of children: Not on file  . Years of education: Not on file  . Highest education level: Not on file  Occupational History  . Not on file  Social Needs  . Financial resource strain: Patient refused  . Food insecurity    Worry: Patient refused    Inability: Patient refused  . Transportation needs    Medical: Patient refused    Non-medical: Patient refused  Tobacco Use  . Smoking status: Current Every Day Smoker    Packs/day: 1.50    Types: Cigarettes  . Smokeless  tobacco: Never Used  . Tobacco comment: 2 cigarettes per day  Substance and Sexual Activity  . Alcohol use: Not Currently  . Drug use: Not Currently    Types: Heroin    Comment: Sober for 1 year  . Sexual activity: Yes  Lifestyle  . Physical activity    Days per week: Patient refused    Minutes per session: Patient refused  . Stress: Patient refused  Relationships  . Social Herbalist on phone: Patient refused    Gets together: Patient refused    Attends religious service: Patient refused    Active member of club or organization: Patient refused    Attends meetings of clubs or organizations: Patient refused    Relationship status: Patient refused  . Intimate partner violence    Fear of current  or ex partner: Patient refused    Emotionally abused: Patient refused    Physically abused: Patient refused    Forced sexual activity: Patient refused  Other Topics Concern  . Not on file  Social History Narrative  . Not on file      Review of Systems  Constitutional: Negative for chills, fatigue, fever and unexpected weight change.  Respiratory: Negative for cough, chest tightness, shortness of breath and wheezing.   Cardiovascular: Negative for chest pain and leg swelling.  Gastrointestinal: Negative for abdominal distention, constipation, diarrhea, nausea and vomiting.  Neurological: Negative for dizziness, weakness, light-headedness and headaches.  Hematological: Does not bruise/bleed easily.       Objective:    BP 135/89   Pulse (!) 102   Temp 98.6 F (37 C)   Wt 192 lb (87.1 kg)   BMI 30.07 kg/m  Nursing note and vital signs reviewed.  Physical Exam Constitutional:      General: She is not in acute distress.    Appearance: She is well-developed.  Cardiovascular:     Rate and Rhythm: Normal rate and regular rhythm.     Heart sounds: Normal heart sounds. No murmur. No friction rub. No gallop.   Pulmonary:     Effort: Pulmonary effort is normal. No respiratory distress.     Breath sounds: Normal breath sounds. No wheezing or rales.  Chest:     Chest wall: No tenderness.  Abdominal:     General: Bowel sounds are normal. There is no distension.     Palpations: Abdomen is soft. There is no mass.     Tenderness: There is no abdominal tenderness. There is no guarding or rebound.  Skin:    General: Skin is warm and dry.  Neurological:     Mental Status: She is alert and oriented to person, place, and time.  Psychiatric:        Behavior: Behavior normal.        Thought Content: Thought content normal.        Judgment: Judgment normal.         Assessment & Plan:   Patient Active Problem List   Diagnosis Date Noted  . Back pain 03/10/2019  . Adrenal mass 1  cm to 4 cm in diameter with no history of malignant neoplasm ([place]) 02/19/2019  . Chronic hepatitis C without hepatic coma ([place]) 02/19/2019  . Essential hypertension   . Methadone use ([place])   . Vision loss, bilateral 02/06/2019     Problem List Items Addressed This Visit      Digestive   Chronic hepatitis C without hepatic coma ([place]) - Primary    Ms.  Orlena Jennings is a 48 y/o female with Genotype 1a Chronic Hepatitis C with viral load of 1.91 million. She appears to be at low risk for liver fibrosis with APRI score of 0.431 and FIB-4 of 1.10. We discussed the pathophysiology, transmission, risks if left untreated and treatment options for Hepatitis C with questions answered. Check Hepatitis B surface antigen and antibody. Plan of care will be treatment with Mavyret x 8 weeks with monitoring at 1 month after start of medication, 8 weeks at completion of medication, and 3 months following treatment for confirmation of SVR.       Relevant Orders   Hepatitis B surface antibody,qualitative   Hepatitis B surface antigen   Liver Fibrosis, FibroTest-ActiTest       I am having Loni Muse maintain her multivitamin with minerals, APPLE CIDER VINEGAR PO, acetaminophen, METHADONE HCL PO, doxycycline, amLODipine, losartan, and predniSONE.   Follow-up: Return in about 1 month (around 04/17/2019), or if symptoms worsen or fail to improve.    Terri Piedra, MSN, FNP-C Nurse Practitioner [facility] for Infectious Disease [facility] Office phone: [phone] Pager: [place] number: [phone]

## 2019-03-22 ENCOUNTER — Telehealth: Payer: Self-pay | Admitting: Pharmacy Technician

## 2019-03-22 NOTE — Telephone Encounter (Signed)
RCID Patient Advocate Encounter  Completed and sent MyAbbvie application for Mavyret for this patient who is uninsured.    Per patient's request, the medication will be shipped to the clinic Thursday, November 5,2020.  We will continue to follow.  Venida Jarvis. Nadara Mustard [place] Patient [facility] for Infectious Disease Phone: [phone] Fax:  [phone]

## 2019-03-23 LAB — LIVER FIBROSIS, FIBROTEST-ACTITEST
ALT: 53 U/L — ABNORMAL HIGH (ref 6–29)
Alpha-2-Macroglobulin: 267 mg/dL (ref 106–279)
Apolipoprotein A1: 196 mg/dL (ref 101–198)
Bilirubin: 0.4 mg/dL (ref 0.2–1.2)
Fibrosis Score: 0.21
GGT: 168 U/L — ABNORMAL HIGH (ref 3–55)
Haptoglobin: 227 mg/dL — ABNORMAL HIGH (ref 43–212)
Necroinflammat ACT Score: 0.28
Reference ID: 3143754

## 2019-03-23 LAB — HEPATITIS B SURFACE ANTIGEN: Hepatitis B Surface Ag: NONREACTIVE

## 2019-03-23 LAB — HEPATITIS B SURFACE ANTIBODY,QUALITATIVE: Hep B S Ab: REACTIVE — AB

## 2019-03-30 ENCOUNTER — Ambulatory Visit: Payer: Self-pay | Admitting: Licensed Clinical Social Worker

## 2019-03-30 DIAGNOSIS — Z789 Other specified health status: Secondary | ICD-10-CM

## 2019-03-30 NOTE — Patient Instructions (Signed)
Licensed Clinical Social Worker Visit Information  Goals we discussed today:  Goals Addressed            This Visit's Progress   . Needs community resources       Current Barriers:  . Lacks knowledge of community resource:   Clinical Social Work Clinical Goal(s):  Marland Kitchen No further follow up required Interventions: . Provided patient with information about general community resources, medical insurance options, and [employer]. Patient Self Care Activities:  . Self administers medications as prescribed . Calls provider office for new concerns or questions . Needs assistance with navigating community resources Initial goal documentation       Materials provided: Yes:  1. [place] [employer] farm manager 2. Information on insurance options 3. Geophysicist/field seismologist Ms. Hamid was given information about Care Management services today including:  1. Care Management services include personalized support from designated clinical staff supervised by her physician, including individualized plan of care and coordination with other care providers 2. 24/7 contact [phone] for assistance for urgent and routine care needs. 3. Care Management services at any time by phone call to the office staff.  Patient agreed to services and verbal consent obtained.    The patient verbalized understanding of instructions provided today and declined a print copy of patient instruction materials.   Maurine Cane, LCSW

## 2019-03-30 NOTE — Chronic Care Management (AMB) (Signed)
  Social work Care Management  Follow Up Note   03/30/2019 Name: Violett Ohayon MRN: [id number] DOB: [birth year]  Referred by: Dr. Chauncey Reading Reason for referral : Care Coordination (community resources)  Margarita Diekman is a 48 y.o. year old female.    Review of patient status, including review of consultants reports, relevant laboratory and other test results, and collaboration with appropriate care team members and the patient's provider was performed as part of comprehensive patient evaluation and provision of chronic care management services.   LCSW received return mail with resources mailed to patient. F/U call to patient to verify address.  Patient confirmed correct address however having difficulty with receiving mail SDOH (Social Determinants of Health) screening performed today: insurance coverag. See Care Plan for related entries.  Advanced Directives: Not address during this encounter. See [employer] application for related entries.  Goals Addressed            This Visit's Progress   . Needs community resources       Current Barriers:  . Lacks knowledge of community resource:   Clinical Social Work Clinical Goal(s):  Marland Kitchen No further follow up required Interventions: . Provided patient with information about general community resources, medical insurance options, and [employer]. Patient Self Care Activities:  . Self administers medications as prescribed . Calls provider office for new concerns or questions . Needs assistance with navigating community resources Initial goal documentation      Plan: 1. The patient will pick up resources from front desk and contact LCSW if she has questions 2. No further follow up required: [place], [place] / [place]   [phone] 4:04 PM

## 2019-04-02 ENCOUNTER — Other Ambulatory Visit: Payer: Self-pay

## 2019-04-02 ENCOUNTER — Other Ambulatory Visit: Payer: Self-pay | Admitting: Family Medicine

## 2019-04-02 DIAGNOSIS — H543 Unqualified visual loss, both eyes: Secondary | ICD-10-CM

## 2019-04-05 ENCOUNTER — Telehealth: Payer: Self-pay | Admitting: Pharmacy Technician

## 2019-04-05 NOTE — Telephone Encounter (Signed)
RCID Patient Advocate Encounter  Patient's medications will be shipped to [facility] December 3rd, 2020 by [place].  This is the second shipment.  Venida Jarvis. Nadara Mustard [place] Patient [facility] for Infectious Disease Phone: [phone] Fax:  [phone]

## 2019-04-06 ENCOUNTER — Ambulatory Visit: Payer: Medicaid Other | Admitting: Pharmacist

## 2019-04-07 ENCOUNTER — Other Ambulatory Visit: Payer: Self-pay

## 2019-04-07 ENCOUNTER — Encounter: Payer: Self-pay | Admitting: Pharmacy Technician

## 2019-04-07 ENCOUNTER — Ambulatory Visit (INDEPENDENT_AMBULATORY_CARE_PROVIDER_SITE_OTHER): Payer: Self-pay | Admitting: Pharmacist

## 2019-04-07 ENCOUNTER — Ambulatory Visit: Payer: Medicaid Other | Admitting: Pharmacist

## 2019-04-07 DIAGNOSIS — B182 Chronic viral hepatitis C: Secondary | ICD-10-CM

## 2019-04-07 MED ORDER — MAVYRET 100-40 MG PO TABS: 3.0000 | ORAL_TABLET | Freq: Every day | ORAL | 1 refills | Status: DC

## 2019-04-07 NOTE — Addendum Note (Signed)
Addended by: Darletta Moll on: 04/07/2019 02:24 PM   Modules accepted: Orders, Level of Service

## 2019-04-07 NOTE — Progress Notes (Signed)
HPI: Annette Jennings is a 48 y.o. female who presents to the [facility] for Hepatitis C follow-up.  Medication: Mavyret  Start Date: 04/07/2019  Hepatitis C Genotype: 1a  Fibrosis Score: F0  Hepatitis C RNA: 1,910,000  Patient Active Problem List   Diagnosis Date Noted  . Back pain 03/10/2019  . Adrenal mass 1 cm to 4 cm in diameter with no history of malignant neoplasm ([place]) 02/19/2019  . Chronic hepatitis C without hepatic coma ([place]) 02/19/2019  . Essential hypertension   . Methadone use ([place])   . Vision loss, bilateral 02/06/2019    Patient's Medications  New Prescriptions   No medications on file  Previous Medications   ACETAMINOPHEN (TYLENOL) 500 MG TABLET    Take 1,000 mg by mouth every 6 (six) hours as needed for headache (pain).   AMLODIPINE (NORVASC) 10 MG TABLET    Take 1 tablet (10 mg total) by mouth daily.   APPLE CIDER VINEGAR PO    Take 1 tablet by mouth daily.   DOXYCYCLINE (VIBRA-TABS) 100 MG TABLET    Take 1 tablet (100 mg total) by mouth every 12 (twelve) hours.   LOSARTAN (COZAAR) 25 MG TABLET    Take 1 tablet (25 mg total) by mouth daily.   METHADONE HCL PO    Take 120 mg by mouth daily.   MULTIPLE VITAMIN (MULTIVITAMIN WITH MINERALS) TABS TABLET    Take 1 tablet by mouth daily.   PREDNISONE (DELTASONE) 10 MG TABLET    Take 1 tablet (10 mg total) by mouth daily with breakfast.  Modified Medications   No medications on file  Discontinued Medications   No medications on file    Allergies: Allergies  Allergen Reactions  . Shellfish Allergy Hives and Other (See Comments)    Tightening of chest    Past Medical History: Past Medical History:  Diagnosis Date  . Hepatitis C   . Hypertension     Social History: Social History   Socioeconomic History  . Marital status: Single    Spouse name: Not on file  . Number of children: Not on file  . Years of education: Not on file  . Highest education level: Not on file  Occupational  History  . Not on file  Social Needs  . Financial resource strain: Patient refused  . Food insecurity    Worry: Patient refused    Inability: Patient refused  . Transportation needs    Medical: Patient refused    Non-medical: Patient refused  Tobacco Use  . Smoking status: Current Every Day Smoker    Packs/day: 1.50    Types: Cigarettes  . Smokeless tobacco: Never Used  . Tobacco comment: 2 cigarettes per day  Substance and Sexual Activity  . Alcohol use: Not Currently  . Drug use: Not Currently    Types: Heroin    Comment: Sober for 1 year  . Sexual activity: Yes  Lifestyle  . Physical activity    Days per week: Patient refused    Minutes per session: Patient refused  . Stress: Patient refused  Relationships  . Social Herbalist on phone: Patient refused    Gets together: Patient refused    Attends religious service: Patient refused    Active member of club or organization: Patient refused    Attends meetings of clubs or organizations: Patient refused    Relationship status: Patient refused  Other Topics Concern  . Not on file  Social History Narrative  .  Not on file    Labs: Hepatitis C Lab Results  Component Value Date   HCVGENOTYPE Comment 03/08/2019   FIBROSTAGE F0 03/17/2019   Hepatitis B Lab Results  Component Value Date   HEPBSAB REACTIVE (A) 03/17/2019   HEPBSAG NON-REACTIVE 03/17/2019   Hepatitis A No results found for: HAV HIV Lab Results  Component Value Date   HIV Non Reactive 02/07/2019   Lab Results  Component Value Date   CREATININE 1.07 (H) 02/12/2019   CREATININE 0.96 02/11/2019   CREATININE 0.78 02/09/2019   CREATININE 0.75 02/08/2019   CREATININE 0.71 02/07/2019   Lab Results  Component Value Date   AST 40 02/19/2019   AST 37 02/08/2019   AST 40 02/07/2019   ALT 53 (H) 03/17/2019   ALT 59 (H) 02/19/2019   ALT 38 02/08/2019    Assessment: Annette Jennings is here at clinic today to pick up and start her [place]. I  met with her to remind her of some of the important things to remember while taking Mavyret. This is a three pill once a day medication that she should take with food. It is also very important to take all pills at once, do not separate them throughout the day. She may experience some headaches, fatigue or nausea as she starts this medication but these common side effects should subside as treatment is continued. I also spoke with her about obtaining her second month of medication when she comes back to get labs in December before she runs out of medication. I reiterated to her how important 100% adherence is with this treatment in order to give her the best chance of cure. She had no additional questions, but if she thinks of any or has any concerns I encouraged her to give [place] a call.   Plan: - Start Mavyret  - Follow up labs 05/03/2019   Nicoletta Dress, PharmD PGY2 Infectious Disease Pharmacy Resident  [facility] for Infectious [facility] 04/07/2019, 11:07 AM

## 2019-04-12 ENCOUNTER — Encounter: Payer: Self-pay | Admitting: Family Medicine

## 2019-04-12 NOTE — Telephone Encounter (Signed)
Patient needs to be seen in clinic again to determine if continued low-dose prednisone is appropriate. Have scheduled an appt for her on 04/19/2019. LM on pt VM regarding this, will call again.  Gladys Damme, MD [place], PGY-1

## 2019-04-17 ENCOUNTER — Other Ambulatory Visit: Payer: Self-pay | Admitting: Family Medicine

## 2019-04-17 DIAGNOSIS — I10 Essential (primary) hypertension: Secondary | ICD-10-CM

## 2019-04-19 ENCOUNTER — Ambulatory Visit: Payer: Medicaid Other | Admitting: Family Medicine

## 2019-04-21 ENCOUNTER — Telehealth: Payer: Self-pay | Admitting: Pharmacy Technician

## 2019-04-21 NOTE — Telephone Encounter (Addendum)
[facility] Patient Advocate Encounter  Patient's medications have been shipped to [facility] from [facility] and are ready for pick up.  [name] was informed and she stated she has enough medication left and will pick up next week.  She picked this final month up today, 05/04/2019.  Annette Jennings. Annette Jennings [place] Patient [facility] for Infectious Disease Phone: [phone] Fax:  [phone]

## 2019-05-03 ENCOUNTER — Other Ambulatory Visit: Payer: Medicaid Other

## 2019-05-07 ENCOUNTER — Other Ambulatory Visit: Payer: Self-pay | Admitting: *Deleted

## 2019-05-07 ENCOUNTER — Encounter: Payer: Self-pay | Admitting: Family Medicine

## 2019-05-07 ENCOUNTER — Ambulatory Visit (INDEPENDENT_AMBULATORY_CARE_PROVIDER_SITE_OTHER): Payer: Self-pay | Admitting: Family Medicine

## 2019-05-07 ENCOUNTER — Other Ambulatory Visit: Payer: Self-pay

## 2019-05-07 VITALS — BP 136/80 | HR 109 | Wt 201.8 lb

## 2019-05-07 DIAGNOSIS — I10 Essential (primary) hypertension: Secondary | ICD-10-CM

## 2019-05-07 DIAGNOSIS — R000 Tachycardia, unspecified: Secondary | ICD-10-CM

## 2019-05-07 DIAGNOSIS — G8929 Other chronic pain: Secondary | ICD-10-CM

## 2019-05-07 DIAGNOSIS — M545 Low back pain: Secondary | ICD-10-CM

## 2019-05-07 DIAGNOSIS — B182 Chronic viral hepatitis C: Secondary | ICD-10-CM

## 2019-05-07 DIAGNOSIS — R635 Abnormal weight gain: Secondary | ICD-10-CM

## 2019-05-07 DIAGNOSIS — H543 Unqualified visual loss, both eyes: Secondary | ICD-10-CM

## 2019-05-07 DIAGNOSIS — F1190 Opioid use, unspecified, uncomplicated: Secondary | ICD-10-CM

## 2019-05-07 DIAGNOSIS — K732 Chronic active hepatitis, not elsewhere classified: Secondary | ICD-10-CM

## 2019-05-07 DIAGNOSIS — Z5989 Other problems related to housing and economic circumstances: Secondary | ICD-10-CM

## 2019-05-07 DIAGNOSIS — Z598 Other problems related to housing and economic circumstances: Secondary | ICD-10-CM

## 2019-05-07 DIAGNOSIS — E278 Other specified disorders of adrenal gland: Secondary | ICD-10-CM

## 2019-05-07 DIAGNOSIS — F1120 Opioid dependence, uncomplicated: Secondary | ICD-10-CM

## 2019-05-07 MED ORDER — PREDNISONE 10 MG PO TABS: 10.0000 mg | ORAL_TABLET | Freq: Every day | ORAL | 0 refills | Status: DC

## 2019-05-07 MED ORDER — LOSARTAN POTASSIUM 25 MG PO TABS: 25.0000 mg | ORAL_TABLET | Freq: Every day | ORAL | 11 refills | Status: DC

## 2019-05-07 MED ORDER — AMLODIPINE BESYLATE 10 MG PO TABS: 10.0000 mg | ORAL_TABLET | Freq: Every day | ORAL | 2 refills | Status: DC

## 2019-05-07 NOTE — Progress Notes (Addendum)
Virtual Visit via Telephone Note  I connected with Annette Jennings on 05/10/19 at  9:45 AM EST by telephone and verified that I am speaking with the correct person using two identifiers.  Location: Patient: Home  Provider: Clinic  This service was conducted via virtual visit.  The patient was located at home. I was located in my office.  Consent was obtained prior to the virtual visit and is aware of possible charges through their insurance for this visit.  The patient is an established patient.  Dr. Estanislado Pandy, MD conducted the virtual visit and Hazel Sams, PA-C acted as scribe during the service.  Office staff helped with scheduling follow up visits after the service was conducted.   I discussed the limitations, risks, security and privacy concerns of performing an evaluation and management service by telephone and the availability of in person appointments. I also discussed with the patient that there may be a patient responsible charge related to this service. The patient expressed understanding and agreed to proceed.  CC: History of Present Illness: Patient is a 48 year old female seen in consultation per request of Dr. Chauncey Reading for evaluation of bilateral visual loss.  I called patient but she was not a very good historian.  I basically confirmed the information which is mentioned in the chart.  It appears that patient started having vision loss starting summer 2020 which has been progressive.  She was hospitalized in June for bilateral vision loss.  She was given Solu-Medrol.  All her autoimmune work-up which I could see had been negative except for mentioned positive rheumatoid factor and I could not find the value in the chart.  According to the chart she also had some autoimmune work-up with ruling ANA and double-stranded DNA and I could not find the results.  Patient today states that she has no history of sicca symptoms, oral ulcers, nasal ulcers, rash, photosensitivity, Raynaud's phenomenon  or arthritis.  She has had some lower back pain.  She denies any radiculopathy.  She states she did not get time to get x-rays.  According to the chart she was given Solu-Medrol and she has been on prednisone.  She was supposed to see ophthalmologist and neurologist.  I do not see any notes in the chart.      Brief Hospital Course:  Patient presented to this hospital with 4 months of prior aggressive bilateral vision loss that has increased recently.  She first started noticing diminishment in her fields of vision at the beginning of the summer, when she went to see ophthalmology.  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  She was admitted for concern for neurosarcoidosis, or a neurologic vasculitis.  Work-up within the hospital was largely unrevealing, MRI brain/orbits was negative for neurosarcoidosis, but empty sella and CSF in optic nerves raised possibility of IIH.  LP was obtained, and CSF values were within normal limits, culture with no growth, and opening pressure was normal, diminishing likelihood of IIH.  Consultation with neurology and ophthalmology, this presentation is most likely due to an autoimmune cause.  CSF ACE negative. Rheumatoid factor positive.  Patient was treated with 6 days of Solu-Medrol 1 g IV, with no sustained improvement.  She was also given doxycycline 100 mg daily x1 month for possible tickborne infection.  Tests for NMO antibody, dsDNA, ANA, Bartonella, Lyme disease antibodies have been  performed and awaiting results.  Review of Systems  Constitutional: Negative for fever and malaise/fatigue.  Eyes: Negative for photophobia, pain, discharge and redness.       Vision loss  Respiratory: Negative for cough, shortness of breath and wheezing.   Cardiovascular: Negative for chest pain and palpitations.  Gastrointestinal: Negative for blood  in stool, constipation and diarrhea.  Genitourinary: Negative for dysuria.  Musculoskeletal: Negative for back pain, joint pain, myalgias and neck pain.  Skin: Negative for rash.  Neurological: Negative for dizziness and headaches.  Psychiatric/Behavioral: Negative for depression. The patient is not nervous/anxious and does not have insomnia.       Observations/Objective: Physical Exam  Constitutional: She is oriented to person, place, and time.  Neurological: She is alert and oriented to person, place, and time.  Psychiatric: Mood, memory, affect and judgment normal.   Patient reports morning stiffness for 0 minute.   Patient denies nocturnal pain.  Difficulty dressing/grooming: Denies Difficulty climbing stairs: Denies Difficulty getting out of chair: Denies Difficulty using hands for taps, buttons, cutlery, and/or writing: Denies   Assessment and Plan: Diagnoses and all orders for this visit:  Vision loss, bilateral- Patient had progressive vision loss since the summer 2020.  She was hospitalized for evaluation.  She was given Solu-Medrol and was discharged on prednisone.  No diagnosis was established.  MRI of her brain was negative.  She had some autoimmune labs drawn but cannot find the results.  Her ACE level was normal.  Ophthalmology and neurology appointments are pending.  I spoke with Dr. Ardelia Mems today.  She stated that she does not have much information about the patient and Dr. Chauncey Reading is on vacation.  She will send a message to Dr. Chauncey Reading who will contact me to give more information.  At this point it is difficult for me to obtain any of her autoimmune labs.  I am also uncertain if she is seeing ophthalmologist or neurologist.  We will make further decisions once I get a response from Dr. Chauncey Reading.  Chronic midline low back pain without sciatica- Patient complains of some lower back pain.  There is no history of radiculopathy.  I noticed that Dr. Chauncey Reading ordered x-rays of  the lumbar spine which has not been done yet.  Weight gain  Adrenal mass 1 cm to 4 cm in diameter with no history of malignant neoplasm ([place])- Noted on the CT scan of the chest.  Essential hypertension  Methadone use ([place])    Follow Up Instructions: She will follow up after I had a conversation with Dr. Chauncey Reading   I discussed the assessment and treatment plan with the patient. The patient was provided an opportunity to ask questions and all were answered. The patient agreed with the plan and demonstrated an understanding of the instructions.   The patient was advised to call back or seek an in-person evaluation if the symptoms worsen or if the condition fails to improve as anticipated.  I provided 30 minutes of non-face-to-face time during this encounter.   Bo Merino, MD   May 12, 2019 I received a phone call from Dr. Chauncey Reading.  She stated the patient was evaluated by Dr. Manuella Ghazi who found optic edema on examination and some autoimmune labs.  She did not have results of the autoimmune labs.  She mentioned that patient had a thorough neurological evaluation while she was in the hospital which was negative.  She will try to retrieve some of those labs from Dr. Trena Platt office and forward  the results to [place].  Once we receive the results we can schedule a follow-up appointment for the patient.  We also discussed that it would be better for her to be seen at [facility] rheumatology clinic as the coordination of care can be better as they can communicate better with Dr. Manuella Ghazi at the [facility].  Dr. Chauncey Reading will try to schedule the appointment. Bo Merino, MD

## 2019-05-07 NOTE — Patient Instructions (Signed)
It was a pleasure to see you!  1. For high blood pressure: take amlodipine 10 mg (1 pill) and losartan 25 mg (1 pill) daily.  2. For your low back pain: I recommend finding a safe place to walk if you can and do regular walking exercise. I have prescribed a low dose of prednisone (10 mg) take once daily x 7 days. I have also ordered a back x-ray.  3. For chronic care, I have referred you to have [facility] call and assess you for health care access and pharmacy benefits. You will get a call from them.  4. For the [facility] card (health care access): please gather the required documents in the form and call to make an appointment at our office with Leory Plowman.  5. Please follow up with rheumatology, neurology and ophthalmology.  Be well and Happy Holidays!  Dr. Chauncey Reading

## 2019-05-09 DIAGNOSIS — R635 Abnormal weight gain: Secondary | ICD-10-CM | POA: Insufficient documentation

## 2019-05-09 DIAGNOSIS — Z598 Other problems related to housing and economic circumstances: Secondary | ICD-10-CM | POA: Insufficient documentation

## 2019-05-09 DIAGNOSIS — Z5989 Other problems related to housing and economic circumstances: Secondary | ICD-10-CM | POA: Insufficient documentation

## 2019-05-09 NOTE — Assessment & Plan Note (Signed)
Mildly elevated BP today, miscommunication re: prescriptions so pt did not understand to take both amlodipine and losartan. Will refill both. - Amlodipine 10 mg daily - Losartan 25 mg daily

## 2019-05-09 NOTE — Assessment & Plan Note (Signed)
At this time patient is dealing with multiple chronic conditions and is under insured. Will try to access better insurance and obtain repeat CT in future.

## 2019-05-09 NOTE — Assessment & Plan Note (Signed)
Patient seen at [facility] and on second month of [place] treatment. Going well so far, she is without complaints today.

## 2019-05-09 NOTE — Assessment & Plan Note (Signed)
Patient has multiple chronic conditions and difficulty accessing medications, imaging, and care due to inability to obtain adult medicaid.  - Refer to chronic care management for assessment of resources including ways to access prescriptions for hypertension (patient has trouble affording amlodipine and losartan) - Gave packet on orange card access. Recommend she read the packet, gather the documentation, and schedule an appointment with Leory Plowman at our clinic. Patient is a good candidate as she is a resident of [place] and cannot obtain medicaid.

## 2019-05-09 NOTE — Assessment & Plan Note (Signed)
Patient is still doing well in recovery and adherent to methadone treatment.

## 2019-05-09 NOTE — Progress Notes (Signed)
Subjective:    Patient ID: Annette Jennings, female    DOB: [birth year], 48 y.o.   MRN: [id number]   CC: follow up for back pain, diminished eye sight, hepatitis c, access to care with multiple chronic conditions  HPI: Back pain: Annette Jennings presents today with continued low back pain that limits her functionality daily. 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 No shooting pain down the leg, no numbness or tingling. She notes that her pain is much improved with low-dose prednisone, which she had previously used as a taper to high-dose steroids in the hospital for progressive bilateral vision loss. She requests another prescription of low dose prednisone today to easy back pain. Patient also uses voltaryn gel OTC, heat pads, and NSAIDs occasionally to relive pain. These offer mild relief for short periods. She is scheduled to see rheumatology next week. Annette Jennings would be of benefit to her, however, with her other chronic conditions and lack of access to insurance, Annette Jennings is not feasible at this time. Patient also notes that she lives in a motel and since management has changed, it is a much more dangerous environment and not safe for her to go out walking.  HTN: Blood pressure mildly elevated to 136/80. Patient only taking amlodipine 10 mg as she thought losartan was for potassium (she was prescribed losartan potassium) will restart losartan 25 mg. Patient needs refill of amlodipine.  Mild tachycardia: At last visit, patient had elevated HR to 115, after resting and water, decreased to low 100s. Today patient has HR 109 and 105. She notes that she has had a mocha drink and 2 sweet teas with no free water. Otherwise feels fine, no chest pain.  Weight gain: patient has significant weight gain since hospitalization in  September, total of 16 lbs. This is most likely due to a combination of increased sugar intake (cravings for sweet food with methadone treatment) as well as high dose steroid use in September with taper. She has not been on steroids since last low dose (10 mg) in October. Of note, patient drinks sweet tea with sugar, mocha coffee daily, and hydration largely from gatorade. She also considers her fruit intake to be from fruit roll-ups and raisins.  Bilateral progressive vision loss: Patient continues to have tunnel vision which limits her mobility. She has not yet seen neurology or ophthalmology. She will see rheumatology next week.  Hepatitis C: Patient is seen at [facility] and has started her second month of [place], going well.  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  Access to care with multiple chronic conditions: Patient is not eligible for adult medicaid in [place], however she has multiple chronic conditions that need increased treatment and imaging. She is not completely blind, but has severely limited vision that prevents her from working, but does not make her eligible for disability.   Smoking status reviewed   ROS: pertinent noted in the HPI  Past medical history, surgical, family, and social history reviewed and updated in the EMR as appropriate.  Objective:  BP 136/80   Pulse (!) 109   Wt 201 lb 12.8 oz (91.5 kg)   SpO2 96%   BMI 31.61 kg/m   Vitals and nursing note reviewed  General: NAD,  pleasant, overweight, able to participate in exam Cardiac: RRR, S1 S2 present. normal heart sounds, no murmurs. Respiratory: CTAB, normal effort, No wheezes, rales or rhonchi Extremities: no edema or cyanosis. No tenderness to palpation over paraspinal muscles or central spine on lumbar exam, negative bilateral SLR tests, full ROM of bilateral hips.  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  Vision loss, bilateral Patient continues to have tunnel vision and is severely limited in her ability to work and exercise. Recommend she follow up with ophthalmology, neurology, and rheumatology.  Methadone use [facility]) Patient is still doing well in recovery and adherent to methadone treatment.  Essential hypertension Mildly elevated BP today, miscommunication re: prescriptions so Annette Jennings did not understand to take both amlodipine and losartan. Will refill both. - Amlodipine 10 mg daily - Losartan 25 mg daily  Adrenal mass 1 cm to 4 cm in diameter with no history of malignant neoplasm ([place]) At this time patient is dealing with multiple chronic conditions and is under insured. Will try to access better insurance and obtain repeat CT in future.  Chronic hepatitis C without hepatic coma ([place]) Patient seen at [facility] and on second month of [place] treatment. Going well so far, she is without complaints today.  Back pain Back pain still present with continued diminish functioning in daily activities, without red-flag symptoms as previously discussed, likely musculoligamentous in nature. Could be due to both physical deconditioning from vision loss combined with weight gain from steroids. She reports that rheumatology (appt next week) recommends spinal x-ray, will place order for that. She also notes that she had much improvement in pain and functioning on low-dose prednisone, will continue for 1 week until Annette Jennings can have telehealth visit with rheumatology. - DG lumbar spine - Prednisone 10 mg x7 days  Weight gain Patient has had 20 lb weight gain, likely multifactorial due to decreased physical activity from  eyesight loss and unsafe neighborhood as well as high-dose steroids + taper from hospital and unhealthy diet. Discussed that sweet tea, gatorade, and mocha drinks are all high in sugar. Recommended steadily decreasing intake of these drinks and increasing free water. Also recommend increasing whole fruits (not dried fruits or fruit products) to help alleviate sweet cravings.  Tachycardia Patient had mild tachycardia at last visit that improved with rest and water. Today is the same with mild tachycardia to 109, 105. However, Annette Jennings notes that she drinks a lot of caffeine: multiple sweet tea and mocha drinks daily. - Recommend limiting caffeine intake by decreasing amount of sweet tea and mocha by 1 drink every day. Should not have more than 1 caffeine drink per day at goal.  - Recommend increasing free water intake - Will monitor at next visit  Under or uninsured Patient has multiple chronic conditions and difficulty accessing medications, imaging, and care due to inability to obtain adult medicaid.  - Refer to chronic care management for assessment of resources including ways to access prescriptions for hypertension (patient has trouble affording amlodipine and losartan) - Gave packet on orange card access. Recommend she read the packet, gather the documentation, and schedule an appointment with Leory Plowman at our clinic. Patient is a good  candidate as she is a resident of [place] and cannot obtain medicaid.   Gladys Damme, MD [place] PGY-1

## 2019-05-09 NOTE — Assessment & Plan Note (Signed)
Patient continues to have tunnel vision and is severely limited in her ability to work and exercise. Recommend she follow up with ophthalmology, neurology, and rheumatology.

## 2019-05-09 NOTE — Assessment & Plan Note (Signed)
Back pain still present with continued diminish functioning in daily activities, without red-flag symptoms as previously discussed, likely musculoligamentous in nature. Could be due to both physical deconditioning from vision loss combined with weight gain from steroids. She reports that rheumatology (appt next week) recommends spinal x-ray, will place order for that. She also notes that she had much improvement in pain and functioning on low-dose prednisone, will continue for 1 week until pt can have telehealth visit with rheumatology. - DG lumbar spine - Prednisone 10 mg x7 days

## 2019-05-09 NOTE — Assessment & Plan Note (Signed)
Patient had mild tachycardia at last visit that improved with rest and water. Today is the same with mild tachycardia to 109, 105. However, pt notes that she drinks a lot of caffeine: multiple sweet tea and mocha drinks daily. - Recommend limiting caffeine intake by decreasing amount of sweet tea and mocha by 1 drink every day. Should not have more than 1 caffeine drink per day at goal.  - Recommend increasing free water intake - Will monitor at next visit

## 2019-05-09 NOTE — Assessment & Plan Note (Addendum)
Patient has had 16 lb weight gain, likely multifactorial due to decreased physical activity from eyesight loss and unsafe neighborhood as well as high-dose steroids + taper from hospital and unhealthy diet. Discussed that sweet tea, gatorade, and mocha drinks are all high in sugar. Recommended steadily decreasing intake of these drinks and increasing free water. Also recommend increasing whole fruits (not dried fruits or fruit products) to help alleviate sweet cravings.

## 2019-05-10 ENCOUNTER — Telehealth (INDEPENDENT_AMBULATORY_CARE_PROVIDER_SITE_OTHER): Payer: Medicaid Other | Admitting: Rheumatology

## 2019-05-10 ENCOUNTER — Telehealth: Payer: Self-pay | Admitting: Family Medicine

## 2019-05-10 ENCOUNTER — Other Ambulatory Visit: Payer: Self-pay

## 2019-05-10 DIAGNOSIS — H543 Unqualified visual loss, both eyes: Secondary | ICD-10-CM

## 2019-05-10 DIAGNOSIS — F1190 Opioid use, unspecified, uncomplicated: Secondary | ICD-10-CM

## 2019-05-10 DIAGNOSIS — I10 Essential (primary) hypertension: Secondary | ICD-10-CM

## 2019-05-10 DIAGNOSIS — G8929 Other chronic pain: Secondary | ICD-10-CM

## 2019-05-10 DIAGNOSIS — E278 Other specified disorders of adrenal gland: Secondary | ICD-10-CM

## 2019-05-10 DIAGNOSIS — M545 Low back pain, unspecified: Secondary | ICD-10-CM

## 2019-05-10 DIAGNOSIS — F1120 Opioid dependence, uncomplicated: Secondary | ICD-10-CM

## 2019-05-10 DIAGNOSIS — R635 Abnormal weight gain: Secondary | ICD-10-CM

## 2019-05-10 NOTE — Telephone Encounter (Signed)
Received call from rheumatology Dr. Estanislado Pandy [phone]) who asked to speak with me about patient's case. Spoke with Dr. Estanislado Pandy, who has questions about patient's prior workup and working differential diagnosis. The consult is not urgent (patient's symptoms ongoing since September). Will route message to patient's PCP Dr. Chauncey Reading, who is much more familiar with the case.  Dr. Chauncey Reading, please give Dr. Estanislado Pandy a call on her cell phone number above, when you are able.  Thanks Leeanne Rio, MD

## 2019-05-10 NOTE — Chronic Care Management (AMB) (Signed)
  Care Management   Note  05/10/2019 Name: Annette Jennings MRN: [id number] DOB: August 22, 1970  Annette Jennings is a 48 y.o. year old female who is a primary care patient of Gladys Damme, MD. I reached out to Loni Muse by phone today in response to a referral sent by Annette Jennings's health plan.    Annette Jennings was given information about care management services today including:  1. Care management services include personalized support from designated clinical staff supervised by her physician, including individualized plan of care and coordination with other care providers 2. 24/7 contact phone numbers for assistance for urgent and routine care needs. 3. The patient may stop care management services at any time by phone call to the office staff.  Patient agreed to services and verbal consent obtained.   Follow up plan: Telephone appointment with CM team member scheduled for:05/11/2019  Glenna Durand, LPN Health Advisor, [place] Management ??[email or web address] ??[phone]

## 2019-05-11 ENCOUNTER — Telehealth: Payer: Medicaid Other

## 2019-05-11 ENCOUNTER — Other Ambulatory Visit: Payer: Self-pay

## 2019-05-18 ENCOUNTER — Ambulatory Visit: Payer: Self-pay | Admitting: Licensed Clinical Social Worker

## 2019-05-18 NOTE — Chronic Care Management (AMB) (Signed)
  Social Work Care Management  Unsccesses Outreach  05/18/2019 Name: Annette Jennings MRN: [id number] DOB: [birth year]  Referred by: Gladys Damme, MD Reason for referral : Care Coordination (resources)  An unsuccessful telephone outreach was attempted today. The patient was referred to the case management team by for assistance with care management and care coordination. Unable to leave voice message as voice mail is not set up.  Follow Up Plan: The care management team will reach out to the patient again over the next 5 to 7 days days.   Casimer Lanius, LCSW Clinical Social Worker [place] / [place]   [phone] 10:13 AM

## 2019-05-21 ENCOUNTER — Other Ambulatory Visit: Payer: Self-pay | Admitting: Family Medicine

## 2019-05-21 DIAGNOSIS — M461 Sacroiliitis, not elsewhere classified: Secondary | ICD-10-CM

## 2019-05-25 ENCOUNTER — Ambulatory Visit: Payer: Medicaid Other | Admitting: Licensed Clinical Social Worker

## 2019-05-25 ENCOUNTER — Other Ambulatory Visit: Payer: Self-pay

## 2019-05-25 NOTE — Chronic Care Management (AMB) (Signed)
   Care Management  Social Work Note  05/25/2019 Name: Annette Jennings MRN: [id number] DOB: 07-28-1970  Annette Jennings is a 49 y.o. year old female who sees Gladys Damme, MD for primary care. The CCM team was consulted for assistance with [facility] application and assistance with obtaining medication .    LCSW called patient to assess needs. Patient has completed paper work for [employer] and will return it to [facility] office.  She reports being able to get her medication and does not need any assistance.  Patient is not interested in assistance from [place] at this time. (update shared with CCM pharmacy)  SDOH (Social Determinants of Health) screening performed today:  Intervention: Reviewed Orange card application process with patient and assessed medication needs. Patient has    Outpatient Encounter Medications as of 05/25/2019  Medication Sig Note  . acetaminophen (TYLENOL) 500 MG tablet Take 1,000 mg by mouth every 6 (six) hours as needed for headache (pain).   Marland Kitchen amLODipine (NORVASC) 10 MG tablet Take 1 tablet (10 mg total) by mouth daily.   . APPLE CIDER VINEGAR PO Take 1 tablet by mouth daily.   Marland Kitchen doxycycline (VIBRA-TABS) 100 MG tablet Take 1 tablet (100 mg total) by mouth every 12 (twelve) hours.   . Glecaprevir-Pibrentasvir (MAVYRET) 100-40 MG TABS Take 3 tablets by mouth daily with breakfast.   . losartan (COZAAR) 25 MG tablet Take 1 tablet (25 mg total) by mouth daily.   Marland Kitchen METHADONE HCL PO Take 120 mg by mouth daily. 02/06/2019: Verified dose from pt's bottle from [facility] [phone]). Pt picks up 6 doses every Thursday and states that she has not yet taken today's dose.  . Multiple Vitamin (MULTIVITAMIN WITH MINERALS) TABS tablet Take 1 tablet by mouth daily.   . predniSONE (DELTASONE) 10 MG tablet Take 1 tablet (10 mg total) by mouth daily with breakfast.    No facility-administered encounter medications on file as of 05/25/2019.    Plan:  Patient will contact office if she needs assistance with her medication or other needs. No additional LCSW services needed at this time.  Casimer Lanius, LCSW Clinical Social Worker [place] / [place]   [phone] 1:14 PM

## 2019-06-03 ENCOUNTER — Telehealth: Payer: Medicaid Other

## 2019-06-03 ENCOUNTER — Other Ambulatory Visit: Payer: Self-pay

## 2019-06-10 ENCOUNTER — Ambulatory Visit: Payer: Medicaid Other | Admitting: Pharmacist

## 2019-06-14 ENCOUNTER — Ambulatory Visit: Payer: Medicaid Other | Admitting: Pharmacist

## 2019-06-14 ENCOUNTER — Other Ambulatory Visit: Payer: Self-pay

## 2019-06-14 DIAGNOSIS — I10 Essential (primary) hypertension: Secondary | ICD-10-CM

## 2019-06-14 MED ORDER — LOSARTAN POTASSIUM 25 MG PO TABS: 25.0000 mg | ORAL_TABLET | Freq: Every day | ORAL | 11 refills | Status: DC

## 2019-06-14 MED ORDER — AMLODIPINE BESYLATE 10 MG PO TABS: 10.0000 mg | ORAL_TABLET | Freq: Every day | ORAL | 2 refills | Status: DC

## 2019-06-22 NOTE — Progress Notes (Unsigned)
Dx w/ hep C 02/2019. Genotype 1a; viral load of 1.91 million; AST 40 / ALT 59; and platelets of 231. 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  04/07/2019: Started on [place] x 8 weeks.  Never got labs in December but got 2nd month of meds  Plan: Hopefully she finished medication. Hep C test

## 2019-06-23 ENCOUNTER — Ambulatory Visit: Payer: Medicaid Other | Admitting: Pharmacist

## 2020-06-07 ENCOUNTER — Other Ambulatory Visit: Payer: Self-pay | Admitting: Family Medicine

## 2020-06-07 DIAGNOSIS — I10 Essential (primary) hypertension: Secondary | ICD-10-CM

## 2020-06-07 MED ORDER — LOSARTAN POTASSIUM 25 MG PO TABS: 25.0000 mg | ORAL_TABLET | Freq: Every day | ORAL | 3 refills | Status: DC

## 2020-06-07 MED ORDER — AMLODIPINE BESYLATE 10 MG PO TABS: 10.0000 mg | ORAL_TABLET | Freq: Every day | ORAL | 3 refills | Status: DC

## 2020-06-07 NOTE — Telephone Encounter (Signed)
Patient is calling requesting to have her Amlodipine and Losartan refilled. Thanks

## 2020-06-09 ENCOUNTER — Ambulatory Visit: Payer: Medicaid Other | Admitting: Family Medicine

## 2020-06-13 ENCOUNTER — Ambulatory Visit: Payer: Medicaid Other | Admitting: Family Medicine

## 2020-06-28 ENCOUNTER — Telehealth: Payer: Self-pay | Admitting: Family Medicine

## 2020-06-28 NOTE — Telephone Encounter (Signed)
ROUTED TO PCP. Marleny Faller, CMA  

## 2020-06-28 NOTE — Telephone Encounter (Signed)
Discussed appt options with patients. An appt slot just opened up on 07/04/20 at 11:10 AM. Patient agreeable to schedule for that appointment.  Gladys Damme, MD [facility], PGY-2

## 2020-06-28 NOTE — Telephone Encounter (Signed)
Patient stopped by would like phone cakk  from doctor she stating she in a drug recovery program and they're saying her that alcohol is showing up in her system.  She is clean for 2 years and doesn't know why it's showing up.  Would like to talk possibly about having A1C checked/  Please call patient its important to her to get this straightened out.  Dr Chauncey Reading doesn't have any appts until March and she doesn't want to see any other doctor.  Please call patient her [phone]

## 2020-07-04 ENCOUNTER — Ambulatory Visit (INDEPENDENT_AMBULATORY_CARE_PROVIDER_SITE_OTHER): Payer: Self-pay | Admitting: Family Medicine

## 2020-07-04 ENCOUNTER — Other Ambulatory Visit: Payer: Self-pay | Admitting: Family Medicine

## 2020-07-04 ENCOUNTER — Other Ambulatory Visit: Payer: Self-pay

## 2020-07-04 ENCOUNTER — Encounter: Payer: Self-pay | Admitting: Family Medicine

## 2020-07-04 VITALS — BP 130/80 | HR 97 | Ht 67.0 in | Wt 184.5 lb

## 2020-07-04 DIAGNOSIS — B182 Chronic viral hepatitis C: Secondary | ICD-10-CM

## 2020-07-04 DIAGNOSIS — Z5989 Other problems related to housing and economic circumstances: Secondary | ICD-10-CM

## 2020-07-04 DIAGNOSIS — I10 Essential (primary) hypertension: Secondary | ICD-10-CM

## 2020-07-04 DIAGNOSIS — M545 Low back pain, unspecified: Secondary | ICD-10-CM

## 2020-07-04 DIAGNOSIS — Z87898 Personal history of other specified conditions: Secondary | ICD-10-CM

## 2020-07-04 DIAGNOSIS — G8929 Other chronic pain: Secondary | ICD-10-CM

## 2020-07-04 DIAGNOSIS — F1190 Opioid use, unspecified, uncomplicated: Secondary | ICD-10-CM

## 2020-07-04 DIAGNOSIS — Z131 Encounter for screening for diabetes mellitus: Secondary | ICD-10-CM

## 2020-07-04 DIAGNOSIS — R7303 Prediabetes: Secondary | ICD-10-CM | POA: Insufficient documentation

## 2020-07-04 LAB — POCT GLYCOSYLATED HEMOGLOBIN (HGB A1C): Hemoglobin A1C: 5.9 % — AB (ref 4.0–5.6)

## 2020-07-04 MED ORDER — AMLODIPINE BESYLATE 10 MG PO TABS: 10.0000 mg | ORAL_TABLET | Freq: Every day | ORAL | 3 refills | Status: DC

## 2020-07-04 NOTE — Progress Notes (Signed)
SUBJECTIVE:   CHIEF COMPLAINT / HPI:   50 yo woman who presents today for an annual exam. Concerns today include patient would like to be screened for diabetes. She is in a MAT program, sober, but reports that she keeps testing positive for EtOH on MAT UDS screening.   HTN: taking amlodipine 10 mg daily, no problems. Reports BP has been good at home, <130/80. Patient is at goal today of 130/80.  Hep C: Patient was positive for Hep C, took 12w of mavyret, did not follow up with [facility]. Will get HCV RNA quant today for TOC.  Vision loss: Patient has same amount of vision loss. She followed up with Dr. Manuella Ghazi, opth, last year and she reports he told her to expect this to be her normal. She cannot afford to follow up due to medicaid family planning.   Sacroiliitis: Dr. Estanislado Pandy had recommended pelvic radiographs for sacroiliitis to w/u autoimmune cause of back pain/vision loss and recommended patient return to [facility] (whom she had previously seen) as they know her better. Patient unable to do so as she could not afford this.  H/o substance use: patient has been sober from alcohol for 2 years, but had several UDS with positive etoh screening at her methadone clinic. She is very adherent to her methadone treatment and has told me for the last year that recovery is the single most important thing to her. She requested A1c check today as she was told DM could be explanation for -OH findings in urine. She qualifies for A1c screening based on age and BMI as well. Other possible explanations include personal products, she does use a lot of perfume and hair spray which have alcohol bases to them. In any case, alcohol use is not a contraindication to methadone treatment.   Healthcare maintenance: patient unable to afford colonoscopy, routine screening labs not covered by medicaid family planning, mammography, pap smear, etc. Recommend orange card so patient can get routine screenings, she had  trouble filling this out last time. Discussed importance of turning in documentation to get her options to cover the above screenings and possible coverage of rheumatology visits, etc. Offered to have CCM/SW check in to assist patient with any questions she has about documentation and how to turn in documents for orange card, etc. Instructions given. Patient is willing to have CCM/SW referral.  PERTINENT  PMH / PSH: HTN, HCV s/p tx, vision loss, methadone use  OBJECTIVE:   BP 130/80   Pulse 97   Ht 5\' 7"  (1.702 m)   Wt 184 lb 8 oz (83.7 kg)   SpO2 99%   BMI 28.90 kg/m   Nursing note and vitals reviewed GEN: age-appropriate, AAW, resting comfortably in chair, NAD, WNWD HEENT: NCAT. PERRLA. Sclera without injection or icterus.  Cardiac: Regular rate and rhythm. Normal S1/S2. No murmurs, rubs, or gallops appreciated. 2+ radial pulses. Lungs: Clear bilaterally to ascultation. No increased WOB, no accessory muscle usage. No w/r/r. Neuro: Alert and at baseline Ext: no edema Psych: Pleasant and appropriate   ASSESSMENT/PLAN:   Essential hypertension Patient only taking amlodipine 10 mg daily, and BP well controlled. She reports <130/80 at home and she is at goal today of 130/80. - refill amlodipine 10 mg daily  Under or uninsured Patient is unable to afford routine healthcare maintenance appropriate for age and difficulty getting comorbid problems covered (vision loss, possible autoimmune problems, adrenal mass work up, etc). Orange card application given today. Patient would like assistance  with completing it. Will place referral to SW/CCM.  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 She had several weeks of EtOH showing up in UDS, unsure why as she is adamant about sobriety from EtOH. Unable to afford EtOH serum test today, however, EtOH use is not a contraindication to continued methadone tx. Possibly personal care products are affecting test result. Recommended  patient not use personal care products prior to next visit. Most recent UDS did not have EtOH in it. If needed, can find referral to another program.  Chronic hepatitis C without hepatic coma [facility]) Patient completed mavyret tx, she did not follow up with [facility]. Will check HCV quant today for TOC. Medicaid family planning should cover this.  Back pain Patient reports continued mild back pain, recommended lumbar spine radiographs and to look for sacroiliitis per last rheumatology recommendation. However, patient cannot afford these. Recommended home exercises, walking, follow up 1 month. Recommended against prednisone usage as patient is prediabetic, see below. No red flags.  Prediabetes BMI 28 and A1c 5.7%. Discussed prediabetes diagnosis and that patient can resolve this with diet and exercise. Exercise is difficult due to vision loss. Discussed possibility of finding friend/family member to help with walking, recommend 30 mins, 3x per week to start. Counseled on diet. Unclear if this is cause for etoh in urine, unlikely.     Gladys Damme, MD [place]

## 2020-07-04 NOTE — Assessment & Plan Note (Signed)
Patient only taking amlodipine 10 mg daily, and BP well controlled. She reports <130/80 at home and she is at goal today of 130/80. - refill amlodipine 10 mg daily

## 2020-07-04 NOTE — Patient Instructions (Addendum)
It was a pleasure to see you today!  1. We will get some labs today.  If they are abnormal or we need to do something about them, I will call you.  If they are normal, I will send you a message on MyChart (if it is active) or a letter in the mail.  If you don't hear from [place] in 2 weeks, please call the office  [phone].  2. Follow up next month on 08/08/20. I have given you the orange card packet, which will help [place] get more important medical care for you covered. At our next appointment I recommend we do a pap smear to stay up to date. I will place a referral to our social work team to call you to see if you need any assistance with the orange card. With the orange card we can get other screenings like colonoscopy, mammography, etc, to help screen for cancers and this may help with your other medical problems as well.  3. You have prediabetes, that is an A1c between 5.7 and 6.4%. Pre diabetes is reversible by changing your diet and increasing exercise. The more fresh fruits and vegetables you add in and decrease starches and carbs (cookies, chips, pasta, bread, rice, etc), the more it will help. Also goal is to find someone to walk with you and try to walk 30 minutes three times per week.  Prediabetes Eating Plan Prediabetes is a condition that causes blood sugar (glucose) levels to be higher than normal. This increases the risk for developing type 2 diabetes (type 2 diabetes mellitus). Working with a health care provider or nutrition specialist (dietitian) to make diet and lifestyle changes can help prevent the onset of diabetes. These changes may help you:  Control your blood glucose levels.  Improve your cholesterol levels.  Manage your blood pressure. What are tips for following this plan? Reading food labels  Read food labels to check the amount of fat, salt (sodium), and sugar in prepackaged foods. Avoid foods that have: ? Saturated fats. ? Trans fats. ? Added sugars.  Avoid foods  that have more than 300 milligrams (mg) of sodium per serving. Limit your sodium intake to less than 2,300 mg each day. Shopping  Avoid buying pre-made and processed foods.  Avoid buying drinks with added sugar. Cooking  Cook with olive oil. Do not use butter, lard, or ghee.  Bake, broil, grill, steam, or boil foods. Avoid frying. Meal planning  Work with your dietitian to create an eating plan that is right for you. This may include tracking how many calories you take in each day. Use a food diary, notebook, or mobile application to track what you eat at each meal.  Consider following a Mediterranean diet. This includes: ? Eating several servings of fresh fruits and vegetables each day. ? Eating fish at least twice a week. ? Eating one serving each day of whole grains, beans, nuts, and seeds. ? Using olive oil instead of other fats. ? Limiting alcohol. ? Limiting red meat. ? Using nonfat or low-fat dairy products.  Consider following a plant-based diet. This includes dietary choices that focus on eating mostly vegetables and fruit, grains, beans, nuts, and seeds.  If you have high blood pressure, you may need to limit your sodium intake or follow a diet such as the DASH (Dietary Approaches to Stop Hypertension) eating plan. The DASH diet aims to lower high blood pressure.   Lifestyle  Set weight loss goals with help from your  health care team. It is recommended that most people with prediabetes lose 7% of their body weight.  Exercise for at least 30 minutes 5 or more days a week.  Attend a support group or seek support from a mental health counselor.  Take over-the-counter and prescription medicines only as told by your health care provider. What foods are recommended? Fruits Berries. Bananas. Apples. Oranges. Grapes. Papaya. Mango. Pomegranate. Kiwi. Grapefruit. Cherries. Vegetables Lettuce. Spinach. Peas. Beets. Cauliflower. Cabbage. Broccoli. Carrots. Tomatoes. Squash.  Eggplant. Herbs. Peppers. Onions. Cucumbers. Brussels sprouts. Grains Whole grains, such as whole-wheat or whole-grain breads, crackers, cereals, and pasta. Unsweetened oatmeal. Bulgur. Barley. Quinoa. Brown rice. Corn or whole-wheat flour tortillas or taco shells. Meats and other proteins Seafood. Poultry without skin. Lean cuts of pork and beef. Tofu. Eggs. Nuts. Beans. Dairy Low-fat or fat-free dairy products, such as yogurt, cottage cheese, and cheese. Beverages Water. Tea. Coffee. Sugar-free or diet soda. Seltzer water. Low-fat or nonfat milk. Milk alternatives, such as soy or almond milk. Fats and oils Olive oil. Canola oil. Sunflower oil. Grapeseed oil. Avocado. Walnuts. Sweets and desserts Sugar-free or low-fat pudding. Sugar-free or low-fat ice cream and other frozen treats. Seasonings and condiments Herbs. Sodium-free spices. Mustard. Relish. Low-salt, low-sugar ketchup. Low-salt, low-sugar barbecue sauce. Low-fat or fat-free mayonnaise. The items listed above may not be a complete list of recommended foods and beverages. Contact a dietitian for more information. What foods are not recommended? Fruits Fruits canned with syrup. Vegetables Canned vegetables. Frozen vegetables with butter or cream sauce. Grains Refined white flour and flour products, such as bread, pasta, snack foods, and cereals. Meats and other proteins Fatty cuts of meat. Poultry with skin. Breaded or fried meat. Processed meats. Dairy Full-fat yogurt, cheese, or milk. Beverages Sweetened drinks, such as iced tea and soda. Fats and oils Butter. Lard. Ghee. Sweets and desserts Baked goods, such as cake, cupcakes, pastries, cookies, and cheesecake. Seasonings and condiments Spice mixes with added salt. Ketchup. Barbecue sauce. Mayonnaise. The items listed above may not be a complete list of foods and beverages that are not recommended. Contact a dietitian for more information. Where to find more  information  American Diabetes Association: www.diabetes.org Summary  You may need to make diet and lifestyle changes to help prevent the onset of diabetes. These changes can help you control blood sugar, improve cholesterol levels, and manage blood pressure.  Set weight loss goals with help from your health care team. It is recommended that most people with prediabetes lose 7% of their body weight.  Consider following a Mediterranean diet. This includes eating plenty of fresh fruits and vegetables, whole grains, beans, nuts, seeds, fish, and low-fat dairy, and using olive oil instead of other fats. This information is not intended to replace advice given to you by your health care provider. Make sure you discuss any questions you have with your health care provider. Document Revised: 08/05/2019 Document Reviewed: 08/05/2019 Elsevier Patient Education  2021 [place] [facility],  Dr. Chauncey Reading

## 2020-07-04 NOTE — Assessment & Plan Note (Signed)
BMI 28 and A1c 5.7%. Discussed prediabetes diagnosis and that patient can resolve this with diet and exercise. Exercise is difficult due to vision loss. Discussed possibility of finding friend/family member to help with walking, recommend 30 mins, 3x per week to start. Counseled on diet. Unclear if this is cause for etoh in urine, unlikely.

## 2020-07-04 NOTE — Assessment & Plan Note (Signed)
Patient compliant by report with methadone tx. She had several weeks of EtOH showing up in UDS, unsure why as she is adamant about sobriety from EtOH. Unable to afford EtOH serum test today, however, EtOH use is not a contraindication to continued methadone tx. Possibly personal care products are affecting test result. Recommended patient not use personal care products prior to next visit. Most recent UDS did not have EtOH in it. If needed, can find referral to another program.

## 2020-07-04 NOTE — Assessment & Plan Note (Signed)
Patient is unable to afford routine healthcare maintenance appropriate for age and difficulty getting comorbid problems covered (vision loss, possible autoimmune problems, adrenal mass work up, etc). Orange card application given today. Patient would like assistance with completing it. Will place referral to SW/CCM.

## 2020-07-04 NOTE — Assessment & Plan Note (Signed)
Patient completed mavyret tx, she did not follow up with [facility]. Will check HCV quant today for TOC. Medicaid family planning should cover this.

## 2020-07-04 NOTE — Assessment & Plan Note (Addendum)
Patient reports continued mild back pain, recommended lumbar spine radiographs and to look for sacroiliitis per last rheumatology recommendation. However, patient cannot afford these. Recommended home exercises, walking, follow up 1 month. Recommended against prednisone usage as patient is prediabetic, see below. No red flags.

## 2020-07-06 LAB — HCV RNA QUANT: Hepatitis C Quantitation: NOT DETECTED IU/mL

## 2020-07-07 ENCOUNTER — Telehealth: Payer: Self-pay | Admitting: Family Medicine

## 2020-07-07 ENCOUNTER — Encounter: Payer: Self-pay | Admitting: Family Medicine

## 2020-07-07 NOTE — Telephone Encounter (Signed)
° °  Telephone encounter was:  Successful.  07/07/2020 Name: Lilyonna Steidle MRN: [id number] DOB: Aug 11, 1970  Raniah Karan is a 50 y.o. year old female who is a primary care patient of Gladys Damme, MD . The community resource team was consulted for assistance with applying for [employer].   Care guide performed the following interventions: Discussed resources to assist with applying for the [facility]. Ms. Hudon stated that she has completed the application for the [facility], but is not sure where to turn in her application. Informed patient that Care Guide will give [facility] a call to see where the patient needs to turn in the application. Patient stated understanding. .  Follow Up Plan:  Care Guide will give patient a call once information from [facility] has been received.   [place], Care Management Phone: [phone] Email: [email or web address]

## 2020-07-11 ENCOUNTER — Telehealth: Payer: Self-pay | Admitting: Family Medicine

## 2020-07-11 NOTE — Telephone Encounter (Signed)
   Telephone encounter was:  Successful.  07/11/2020 Name: Annette Jennings MRN: [id number] DOB: 20-Jul-1970  Annette Jennings is a 50 y.o. year old female who is a primary care patient of Gladys Damme, MD . The community resource team was consulted for assistance with [place] guide performed the following interventions: Spoke with patient today regarding referral. Informed patient that [place] with [facility] about returning the [employer].  Explained to patient that there are several ways to return her application along with required documentation.  Paitent can return via mail: at [address], [place], [place] [address], by e-mail at [email or web address], by fax  at [phone], or application and documents can be dropped off at [facility] at the recieptionist desk and make sure to let the receiptionist know that you are dropping off an [employer] and make sure everything is in an envelope with your name on it. Patient stated that she will drop application off at [facility]. No addtional needs at this time.  .  Follow Up Plan:  No further follow up planned at this time. The patient has been provided with needed resources.  [place], Care Management Phone: [phone] Email: [email or web address]

## 2020-07-19 ENCOUNTER — Ambulatory Visit: Payer: Medicaid Other | Admitting: Family Medicine

## 2020-08-08 ENCOUNTER — Other Ambulatory Visit (HOSPITAL_COMMUNITY)
Admission: RE | Admit: 2020-08-08 | Discharge: 2020-08-08 | Disposition: A | Discharge status: Home / Self Care | Payer: Medicaid Other | Source: Ambulatory Visit | Attending: Family Medicine | Admitting: Family Medicine

## 2020-08-08 ENCOUNTER — Encounter: Payer: Self-pay | Admitting: Family Medicine

## 2020-08-08 ENCOUNTER — Ambulatory Visit (INDEPENDENT_AMBULATORY_CARE_PROVIDER_SITE_OTHER): Payer: Medicaid Other | Admitting: Family Medicine

## 2020-08-08 ENCOUNTER — Other Ambulatory Visit: Payer: Self-pay

## 2020-08-08 VITALS — BP 130/70 | Ht 67.0 in | Wt 183.2 lb

## 2020-08-08 DIAGNOSIS — H543 Unqualified visual loss, both eyes: Secondary | ICD-10-CM

## 2020-08-08 DIAGNOSIS — Z5989 Other problems related to housing and economic circumstances: Secondary | ICD-10-CM

## 2020-08-08 DIAGNOSIS — B182 Chronic viral hepatitis C: Secondary | ICD-10-CM | POA: Diagnosis not present

## 2020-08-08 DIAGNOSIS — R7303 Prediabetes: Secondary | ICD-10-CM

## 2020-08-08 DIAGNOSIS — Z124 Encounter for screening for malignant neoplasm of cervix: Secondary | ICD-10-CM | POA: Insufficient documentation

## 2020-08-08 DIAGNOSIS — Z0000 Encounter for general adult medical examination without abnormal findings: Secondary | ICD-10-CM

## 2020-08-08 DIAGNOSIS — I10 Essential (primary) hypertension: Secondary | ICD-10-CM

## 2020-08-08 DIAGNOSIS — Z1231 Encounter for screening mammogram for malignant neoplasm of breast: Secondary | ICD-10-CM

## 2020-08-08 NOTE — Assessment & Plan Note (Signed)
BP well controlled and at goal. Continue amlodipine 10 mg daily.

## 2020-08-08 NOTE — Assessment & Plan Note (Signed)
Pap smear obtained today. Low risk for STI on history. Mammogram ordered.

## 2020-08-08 NOTE — Assessment & Plan Note (Signed)
Patient is making changes in diet since difficulty exercising. Will monitor q6 months

## 2020-08-08 NOTE — Progress Notes (Signed)
    SUBJECTIVE:   CHIEF COMPLAINT / HPI:  F/u bp, pap  Pap: Patient due for pap smear today. She has not been sexually active in >1 year, declines STI testing as low risk. Has no known h/o abnormal pap smears.  BP: at goal of 130/70. Taking amlodipine 10 mg daily. Asymptomatic. Reports takes BP at home too and <130/80.   Social barriers: patient cannot afford insurance, presently only eligible for [place] family planning medicaid. Patient has put in application for orange card. Will order mammogram to get scheduled within the next  6 months. Patient is also legally blind (please see history 1 year ago). She would like to apply for disability.   Weight: patient is overweight at BMI of 28, however, she has lost 18 pounds in the last year. BP is controlled, no prediabetes. Patient has difficulty exercising due to sight, but does things like crunches. She is attempting to diet by cutting back calories, eating more fruits and vegetables.  PERTINENT  PMH / PSH: HTN, legally blind  OBJECTIVE:   BP 130/70   Ht 5\' 7"  (1.702 m)   Wt 183 lb 4 oz (83.1 kg)   BMI 28.70 kg/m   Nursing note and vitals reviewed GEN: age-appropriate, AAW, resting comfortably in chair, NAD, WNWD HEENT: NCAT. PERRLA. Sclera without injection or icterus. MMM.  Cardiac: Regular rate and rhythm. Normal S1/S2. No murmurs, rubs, or gallops appreciated. 2+ radial pulses. Lungs: Clear bilaterally to ascultation. No increased WOB, no accessory muscle usage. No w/r/r. PELVIC:  Normal appearing external female genitalia, normal vaginal epithelium, no abnormal discharge. Normal appearing cervix.  Neuro: Alert and at baseline Ext: no edema Psych: Pleasant and appropriate  ASSESSMENT/PLAN:   Chronic hepatitis C without hepatic coma (HCC) Hep C cleared on TOC  Essential hypertension BP well controlled and at goal. Continue amlodipine 10 mg daily.  Vision loss, bilateral Patient interested in disability. Gave her website/address  for DSS and form to fill out to start application. [facility] does not complete medical portion.  Prediabetes Patient is making changes in diet since difficulty exercising. Will monitor q6 months  Under or uninsured Orange card application completed and turned in. Will follow up with further health care maintenance if orange card granted.  Healthcare maintenance Pap smear obtained today. Low risk for STI on history. Mammogram ordered.     Gladys Damme, MD [place]

## 2020-08-08 NOTE — Assessment & Plan Note (Signed)
Patient interested in disability. Gave her website/address for DSS and form to fill out to start application. [facility] does not complete medical portion.

## 2020-08-08 NOTE — Assessment & Plan Note (Signed)
Orange card application completed and turned in. Will follow up with further health care maintenance if orange card granted.

## 2020-08-08 NOTE — Patient Instructions (Addendum)
It was a pleasure to see you today!  1. We did a pap smear today. If anything is abnormal, I will give you call. If everything is normal, you will not need a repeat for 5 years.   2. For a mammogram: please call the [facility] [place] to schedule a mammogram  3. Follow up for blood pressure and other necessary health maintenance tests within the next 6 months after your orange card application. Let me know if you have trouble because we may be able to help  4. For disability: start by going to the Department of Social Services. They will give you paperwork and tell you how to be seen by occupational medicine, who will help determine if you qualify. You can start application online at: [email or web address]   Be Well,  Dr. Chauncey Reading

## 2020-08-08 NOTE — Assessment & Plan Note (Signed)
Hep C cleared on TOC

## 2020-08-10 ENCOUNTER — Telehealth: Payer: Self-pay | Admitting: Family Medicine

## 2020-08-10 LAB — CYTOLOGY - PAP
Comment: NEGATIVE
Diagnosis: HIGH — AB
High risk HPV: POSITIVE — AB

## 2020-08-10 NOTE — Telephone Encounter (Signed)
Thank you for sharing this patient's information with [place]. Based on her report, she need immediate LEEP treatment and should be referred to [place] would be an alternative step in [facility], and not with our clinic since we are [place].  Please refer to [place].

## 2020-08-10 NOTE — Telephone Encounter (Signed)
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 Called and discussed diagnosis with patient. Scheduled in colpo clinic on April 7th at 9:20 AM.   Gladys Damme, MD [place] [facility], PGY-2

## 2020-08-11 ENCOUNTER — Other Ambulatory Visit: Payer: Self-pay | Admitting: Family Medicine

## 2020-08-11 DIAGNOSIS — R87613 High grade squamous intraepithelial lesion on cytologic smear of cervix (HGSIL): Secondary | ICD-10-CM | POA: Insufficient documentation

## 2020-08-11 NOTE — Telephone Encounter (Signed)
Per Elmo Putt RN and Joylene John, NP at [facility] patient can be seen at [facility] for urgent LEEP.  Will place in their WQ. Jazmin Hartsell,CMA

## 2020-08-11 NOTE — Telephone Encounter (Signed)
I called and spoke with gyn onc scheduling and they will let Melissa np know about urgent referral and call patient to schedule the appointment. Kemper Hochman,CMA

## 2020-08-11 NOTE — Telephone Encounter (Signed)
Discussed with Dr. Gwendlyn Deutscher, will refer to gyn-onc for leep. Referral placed as urgent. Will update patient.  Gladys Damme, MD [employer] [facility], PGY-2

## 2020-08-21 ENCOUNTER — Other Ambulatory Visit: Payer: Self-pay | Admitting: Obstetrics and Gynecology

## 2020-08-21 DIAGNOSIS — Z1231 Encounter for screening mammogram for malignant neoplasm of breast: Secondary | ICD-10-CM

## 2020-08-21 NOTE — Telephone Encounter (Signed)
Followed up with patient re: HGSIL, HPV+ pap. Dr. Gwendlyn Deutscher recommended patient be referred to OB-GYN asap, she has been placed in the Gyn-Onc work queue. I called patient to see if she had heard from them yet. She says that she screens many calls and does not have VM setup. I recommended that she call them [phone], set up VM, and be on the look out for 336 numbers. Discussed with patient that this is imperative to be worked up quickly.   Gladys Damme, MD [place] [facility], PGY-2

## 2020-08-21 NOTE — Telephone Encounter (Signed)
There is a referral note from 08-17-20 from womens :  Patient information will be forwarded to [facility]  Nino Glow [phone]), patient insurance will not cover the service in our office .    I called bccp and left message with Jerene Pitch.  She will call patient to get her scheduled for this.  Keondrick Dilks,CMA

## 2020-08-24 ENCOUNTER — Other Ambulatory Visit: Payer: Self-pay

## 2020-08-24 ENCOUNTER — Ambulatory Visit: Payer: Medicaid Other

## 2020-08-24 ENCOUNTER — Ambulatory Visit: Payer: Medicaid Other | Admitting: *Deleted

## 2020-08-24 VITALS — Wt 186.9 lb

## 2020-08-24 DIAGNOSIS — R87613 High grade squamous intraepithelial lesion on cytologic smear of cervix (HGSIL): Secondary | ICD-10-CM

## 2020-08-24 DIAGNOSIS — Z1239 Encounter for other screening for malignant neoplasm of breast: Secondary | ICD-10-CM

## 2020-08-24 NOTE — Patient Instructions (Signed)
Explained breast self awareness with Loni Muse. Patient did not need a Pap smear today due to last Pap smear was 08/08/2020. Explained the colposcopy the recommended follow-up for her abnormal Pap smear. Referred patient to the [facility] for [place] for a colposcopy to follow-up for her abnormal Pap smear. Appointment scheduled Tuesday, Oct 03, 2020 at 0835. Referred patient to the [place] for a screening mammogram on the mobile unit. Appointment scheduled Tuesday, Oct 03, 2020 at [place]. Patient aware of appointments and will be there. Discussed smoking cessation with patient. Referred to the [facility] Quitline and gave resources to the free smoking cessation classes at [facility]. Remmy Crass verbalized understanding.  Menashe Kafer, [place], RN 1:53 PM

## 2020-08-24 NOTE — Progress Notes (Signed)
[name] is a 50 y.o. female who presents to [facility] clinic today with no complaints. Patient referred to [facility] by Annette Jennings due to having an abnormal Pap smear 08/08/2020 that a colposcopy is recommended for follow-up.   Pap Smear: Pap smear not completed today. Last Pap smear was 08/08/2020 at [place] clinic and was abnormal - HSIL with positive HPV. Per patient has no history of an abnormal Pap smear prior to her most recent Pap smear. Last Pap smear result is available in Epic.   Physical exam: Breasts Left breast slightly larger than right breast that per patient is normal for her. No skin abnormalities bilateral breasts. No nipple retraction bilateral breasts. No nipple discharge bilateral breasts. No lymphadenopathy. No lumps palpated bilateral breasts. No complaints of pain or tenderness on exam.       Pelvic/Bimanual Pap is not indicated today per BCCCP guidelines.    Smoking History: Patient is a current smoker. Discussed smoking cessation with patient. Referred to the [facility] Quitline and gave resources to the free smoking cessation classes at [facility].   Patient Navigation: Patient education provided. Access to services provided for patient through [place] program.   Colorectal Cancer Screening: Per patient has never had colonoscopy completed. No complaints today.    Breast and Cervical Cancer Risk Assessment: Patient does not have family history of breast cancer, known genetic mutations, or radiation treatment to the chest before age 6. Patient does not have history of cervical dysplasia, immunocompromised, or DES exposure in-utero.  Risk Assessment    Risk Scores      08/24/2020   Last edited by: Annette Revel, LPN   5-year risk: 1.1 %   Lifetime risk: 8.7 %          A: BCCCP exam without pap smear No complaints.  P: Referred patient to the [place] for a screening mammogram on the mobile unit.  Appointment scheduled Tuesday, Oct 03, 2020 at [place].  Referred patient to the [facility] for [place] for a colposcopy to follow-up for her abnormal Pap smear. Appointment scheduled Tuesday, Oct 03, 2020 at 0835.  Annette Parish, RN 08/24/2020 1:53 PM

## 2020-10-03 ENCOUNTER — Ambulatory Visit: Payer: Medicaid Other

## 2020-10-03 ENCOUNTER — Ambulatory Visit (INDEPENDENT_AMBULATORY_CARE_PROVIDER_SITE_OTHER): Payer: Self-pay | Admitting: Family Medicine

## 2020-10-03 ENCOUNTER — Other Ambulatory Visit (HOSPITAL_COMMUNITY)
Admission: RE | Admit: 2020-10-03 | Discharge: 2020-10-03 | Disposition: A | Discharge status: Home / Self Care | Payer: Medicaid Other | Source: Ambulatory Visit | Attending: Student | Admitting: Student

## 2020-10-03 ENCOUNTER — Encounter: Payer: Self-pay | Admitting: Family Medicine

## 2020-10-03 ENCOUNTER — Other Ambulatory Visit: Payer: Self-pay

## 2020-10-03 VITALS — BP 139/91 | HR 108 | Ht 67.0 in | Wt 185.7 lb

## 2020-10-03 DIAGNOSIS — D060 Carcinoma in situ of endocervix: Secondary | ICD-10-CM | POA: Diagnosis not present

## 2020-10-03 DIAGNOSIS — R87613 High grade squamous intraepithelial lesion on cytologic smear of cervix (HGSIL): Secondary | ICD-10-CM | POA: Insufficient documentation

## 2020-10-03 NOTE — Addendum Note (Signed)
Addended by: Clayton Lefort on: 10/03/2020 10:15 AM   Modules accepted: Orders

## 2020-10-03 NOTE — Patient Instructions (Signed)
Colposcopy, Care After This sheet gives you information about how to care for yourself after your procedure. Your doctor may also give you more specific instructions. If you have problems or questions, contact your doctor. What can I expect after the procedure? If you did not have a sample of your tissue taken out (did not have a biopsy), you may only have some spotting of blood for a few days. You can go back to your normal activities. If you had a sample of your tissue taken out, it is common to have:  Soreness and mild pain. These may last for a few days.  A light-headed feeling.  Mild bleeding or fluid (discharge) coming from your vagina. The fluid will look dark and grainy. You may have this for a few days. The fluid may be caused by a liquid that was used during your procedure. You may need to wear a sanitary pad.  Spotting of blood for at least 48 hours after the procedure. Follow these instructions at home: Medicines  Take over-the-counter and prescription medicines only as told by your doctor.  Ask your doctor what medicines you can start taking again. This is very important if you take blood thinners. Activity  Limit your activity for the first day after your procedure as told by your doctor.  For at least 3 days, or for as long as told by your doctor, avoid: ? Douching. ? Using tampons. ? Having sex.  Return to your normal activities as told by your doctor. Ask your doctor what activities are safe for you. General instructions  Drink enough fluid to keep your pee (urine) pale yellow.  Ask your doctor if you may take baths, swim, or use a hot tub. You may take showers.  If you use birth control (contraception), keep using it.  Keep all follow-up visits as told by your doctor. This is important.   Contact a doctor if:  You get a skin rash. Get help right away if:  You bleed a lot from your vagina. A lot of bleeding means you use more than one pad an hour for 2  hours in a row.  You have clumps of blood (blood clots) coming from your vagina.  You have a fever or chills.  You have signs of infection. This may be fluid coming from your vagina that is: ? Different than normal. ? Yellow. ? Bad-smelling.  You have very bad pain or cramps in your lower belly that do not get better with medicine.  You faint. Summary  If you did not have a sample of your tissue taken out, you may only have some spotting of blood for a few days. You can go back to your normal activities.  If you had a sample of your tissue taken out, it is common to have mild pain for a few days and spotting for 48 hours.  Avoid douching, using tampons, and having sex for at least 3 days after the procedure or for as long as told.  Get help right away if you have a lot of bleeding, very bad pain, or signs of infection. This information is not intended to replace advice given to you by your health care provider. Make sure you discuss any questions you have with your health care provider. Document Revised: 03/07/2020 Document Reviewed: 05/05/2019 Elsevier Patient Education  2021 [employer].

## 2020-10-03 NOTE — Progress Notes (Signed)
    GYNECOLOGY CLINIC COLPOSCOPY PROCEDURE NOTE  50 y.o. No obstetric history on file. 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 Discussed role for HPV in cervical dysplasia, need for surveillance, nature of the procedure, and risks and benefits.  Lab Results  Component Value Date   DIAGPAP (A) 08/08/2020    - High grade squamous intraepithelial lesion (HSIL)   HPVHIGH Positive (A) 08/08/2020   Pregnancy test: post menopausal  Allergies  Allergen Reactions  . Shellfish Allergy Hives and Other (See Comments)    Tightening of chest    Placed in lithotomy position. Cervix viewed with speculum and colposcope after application of acetic acid.   Colposcopy Adequacy Cervix fully visualized: Yes  SCJ fully visualized: No unable to identify fully  Colposcopy Findings dense acetowhite lesion(s) noted at 6-12 o'clock and fine punctation noted at 6-12 o'clock. Bleeding noted at 6 o clock  Corresponding biopsies were obtained from 12, 6, 7, and 10 o' clock.    ECC specimen was obtained.  All specimens were labeled and sent to pathology.  Hemostatic measures: Pressure and Monsel's solution  Complications: none  Patient tolerated the procedure well.  OBGyn Exam  Colposcopy Impressions High grade features  Plan Treatment plan pending biopsy results, per patient preference they will be communicated by telephone.  Patient was given post procedure instructions.  Will follow up pathology and manage accordingly; patient will be contacted with results and recommendations.  Routine preventative health maintenance measures emphasized.  Clarnce Flock, MD/MPH Attending Family Medicine Physician, [facility] for [facility], [place], [place]

## 2020-10-05 ENCOUNTER — Telehealth: Payer: Self-pay | Admitting: Family Medicine

## 2020-10-05 ENCOUNTER — Encounter: Payer: Self-pay | Admitting: Family Medicine

## 2020-10-05 DIAGNOSIS — C539 Malignant neoplasm of cervix uteri, unspecified: Secondary | ICD-10-CM | POA: Insufficient documentation

## 2020-10-05 DIAGNOSIS — D069 Carcinoma in situ of cervix, unspecified: Secondary | ICD-10-CM | POA: Insufficient documentation

## 2020-10-05 LAB — SURGICAL PATHOLOGY

## 2020-10-05 NOTE — Telephone Encounter (Signed)
Attempted to call patient, no answer and unable to leave a voice mail.  Please call her back again to let her know her colpo biopsy shows CIN-III severe dysplasia and she will need a LEEP. Please schedule her with a OBGYN provider for next available.

## 2020-10-05 NOTE — Telephone Encounter (Signed)
Patient called back front office and call transferred to nurse. I explained her results and recommendations for LEEP per Dr. Dione Plover. I answered her questions. I informed her registrar will call her with LEEP appointment and that appointment will likely be several weeks away  I messaged registrar to schedule appointment and call patient as soon as they can because patient a little anxious . Patient voices understanding. Red Mandt,RN

## 2020-10-20 ENCOUNTER — Telehealth: Payer: Self-pay

## 2020-10-20 NOTE — Telephone Encounter (Signed)
Attempted to contact regarding completing a BCCCP Medicaid application. Received message "Voicemail box has not been set up, not able to leave a message".

## 2020-10-31 ENCOUNTER — Encounter: Payer: Self-pay | Admitting: Family Medicine

## 2021-02-07 ENCOUNTER — Telehealth: Payer: Self-pay | Admitting: Family Medicine

## 2021-02-07 NOTE — Telephone Encounter (Signed)
Contacted patient as she had a CIN-3 colposcopy with clinically concerning features on exam but has not yet been scheduled for a LEEP.  Able to reach her on the phone, emphasized she has a pre-cancerous lesion with a high risk of progression to cervical cancer if she does not get a LEEP done ASAP. She is available now, spoke with admin staff, they will contact patient today to schedule a LEEP.  FYI to BCCCP as well

## 2021-02-08 ENCOUNTER — Other Ambulatory Visit (HOSPITAL_COMMUNITY)
Admission: RE | Admit: 2021-02-08 | Discharge: 2021-02-08 | Disposition: A | Discharge status: Home / Self Care | Payer: Medicaid Other | Source: Ambulatory Visit | Attending: Obstetrics and Gynecology | Admitting: Obstetrics and Gynecology

## 2021-02-08 ENCOUNTER — Ambulatory Visit (INDEPENDENT_AMBULATORY_CARE_PROVIDER_SITE_OTHER): Payer: Self-pay | Admitting: Obstetrics and Gynecology

## 2021-02-08 ENCOUNTER — Other Ambulatory Visit: Payer: Self-pay

## 2021-02-08 ENCOUNTER — Encounter: Payer: Self-pay | Admitting: Obstetrics and Gynecology

## 2021-02-08 VITALS — BP 154/100 | HR 122 | Ht 67.0 in | Wt 173.4 lb

## 2021-02-08 DIAGNOSIS — Z3202 Encounter for pregnancy test, result negative: Secondary | ICD-10-CM

## 2021-02-08 DIAGNOSIS — D069 Carcinoma in situ of cervix, unspecified: Secondary | ICD-10-CM

## 2021-02-08 LAB — POCT PREGNANCY, URINE: Preg Test, Ur: NEGATIVE

## 2021-02-08 NOTE — Procedures (Signed)
Loop Electrosurgical Excisional Procedure Note  Pre-operative Diagnosis: 08/08/2020 pap: HSIL/HPV+  Post-operative Diagnosis: CIN 3  Procedure Details  LMP: patient is postmenopausal   The risks (including infection, bleeding, pain, cervical stenosis) and benefits of the procedure were explained to the patient and written informed consent was obtained.  The patient was placed in the dorsal lithotomy position. A coated Graves was speculum inserted in the vagina, and the cervix was visualized.  A colposcopy was performed with acetic acid and lugol's staining, with the below noted findings. The cervical stromal bed was injected with 79mL of 1% lidocaine with epinephrine. Entering at 12 o'clock on the cervix and using a large Fischer Loop and a setting of 50/50 blend current, a LEEP was done, in on circumferential fashion.  Next, an ECC was done and hemostasis achieved with the ball electrode on 50 coagulation current and application of Monsel's. On coagulation, the entire LEEP bed and surgical margins were thoroughly cauterized  Findings: diffuse awe changes and mosacisim with increased vascularity  Adequate: yes  Specimens: LEEP specimen (circumferential, 360 degrees) and ECC   Condition: Stable  Complications: None  Plan: The patient was advised to call for any fever or for prolonged or severe pain or bleeding. She was advised to use OTC analgesics as needed for mild to moderate pain. Pelvic rest was advised until after her four week post operative visit.    Durene Romans MD Attending Center for [employer] Fish farm manager)

## 2021-02-12 ENCOUNTER — Telehealth: Payer: Self-pay | Admitting: Obstetrics and Gynecology

## 2021-02-12 DIAGNOSIS — C539 Malignant neoplasm of cervix uteri, unspecified: Secondary | ICD-10-CM

## 2021-02-12 LAB — SURGICAL PATHOLOGY

## 2021-02-12 NOTE — Telephone Encounter (Signed)
GYN Telephone Note  Dr. Tammi Klippel with pathology called me to tell me re: her LEEP results. I called the patient but no answer and VM not set up yet [phone]). Will try again later.   Durene Romans MD Attending Center for [employer] (Faculty Practice) 02/12/2021 Time: [phone]

## 2021-02-14 ENCOUNTER — Telehealth: Payer: Self-pay

## 2021-02-14 ENCOUNTER — Telehealth: Payer: Self-pay | Admitting: Obstetrics and Gynecology

## 2021-02-14 DIAGNOSIS — D069 Carcinoma in situ of cervix, unspecified: Secondary | ICD-10-CM

## 2021-02-14 DIAGNOSIS — C539 Malignant neoplasm of cervix uteri, unspecified: Secondary | ICD-10-CM

## 2021-02-14 NOTE — Telephone Encounter (Signed)
GYN Telephone Note  Patient called and d/w her re: LEEP pathology. I told her I recommend referral to GYN Oncology which she is amenable to. Will order pre-visit PET scan, too.  Referral and PET scan order placed  Durene Romans MD Attending Center for [place] (Faculty Practice) 02/14/2021 Time: [phone]

## 2021-02-14 NOTE — Telephone Encounter (Signed)
-----   Message from Aletha Halim, MD sent at 02/14/2021  8:23 AM EDT ----- Please set her up for an urgent gyn onc visit and for a PET scan before her visit. Orders are in.

## 2021-02-14 NOTE — Telephone Encounter (Signed)
Referral placed to GYN ONC. Called for appt, spoke with michelle. Advised will call pt for appt.   Call placed to pt. Pt given appt for PET Scan.  PET scan scheduled for 10/12@ 10am at [facility]. Water only 6 hours before the scan. BP meds only before scan. Pt verbalized understanding.   Colletta Maryland, RN

## 2021-02-15 ENCOUNTER — Telehealth: Payer: Self-pay | Admitting: *Deleted

## 2021-02-15 NOTE — Telephone Encounter (Signed)
Spoke with the patient and scheduled a new patient on 10/17 at 11:15 am with Dr Berline Lopes. Patient given the address and phone number for the clinic; along with the policy for mask and visitors. Patient offered earlier appt dates, patient declined

## 2021-02-17 ENCOUNTER — Encounter: Payer: Self-pay | Admitting: Radiology

## 2021-02-28 ENCOUNTER — Ambulatory Visit (HOSPITAL_COMMUNITY)
Admission: RE | Admit: 2021-02-28 | Discharge: 2021-02-28 | Disposition: A | Discharge status: Home / Self Care | Payer: Self-pay | Source: Ambulatory Visit | Attending: Obstetrics and Gynecology | Admitting: Obstetrics and Gynecology

## 2021-02-28 ENCOUNTER — Other Ambulatory Visit: Payer: Self-pay

## 2021-02-28 DIAGNOSIS — D069 Carcinoma in situ of cervix, unspecified: Secondary | ICD-10-CM | POA: Insufficient documentation

## 2021-02-28 DIAGNOSIS — C539 Malignant neoplasm of cervix uteri, unspecified: Secondary | ICD-10-CM | POA: Insufficient documentation

## 2021-03-02 ENCOUNTER — Encounter (HOSPITAL_COMMUNITY): Payer: Self-pay

## 2021-03-02 ENCOUNTER — Encounter (HOSPITAL_COMMUNITY): Admission: RE | Admit: 2021-03-02 | Payer: Self-pay | Source: Ambulatory Visit

## 2021-03-05 ENCOUNTER — Ambulatory Visit: Payer: Medicaid Other | Admitting: Gynecologic Oncology

## 2021-03-05 NOTE — Progress Notes (Unsigned)
GYNECOLOGIC ONCOLOGY NEW PATIENT CONSULTATION   Patient Name: Annette Jennings  Patient Age: 50 y.o. Date of Service: *** Referring Provider: Gladys Damme, [address] [place],  [place] [address]   Primary Care Provider: Gladys Damme, MD Consulting Provider: Jeral Pinch, MD   Assessment/Plan:  ***   A copy of this note was sent to the patient's referring provider.   Jeral Pinch, MD  Division of Gynecologic Oncology  Department of Obstetrics and Gynecology  [facility]  ___________________________________________  Chief Complaint: Chief Complaint  Patient presents with   High grade squamous intraepithelial lesion (HGSIL), grade 3    History of Present Illness:  Annette Jennings is a 50 y.o. y.o. female who is seen in consultation at the request of Gladys Damme, MD for an evaluation of ***  PAST MEDICAL HISTORY:  Past Medical History:  Diagnosis Date   Bone cancer ([place])    Hepatitis C    Hypertension      PAST SURGICAL HISTORY:  Past Surgical History:  Procedure Laterality Date   HYSTEROTOMY     LEG SURGERY      OB/GYN HISTORY:  OB History  No obstetric history on file.    No LMP recorded (lmp unknown). Patient is postmenopausal.  Age at menarche: ***  Age at menopause: *** Hx of HRT: *** Hx of STDs: *** Last pap: *** History of abnormal pap smears: ***  SCREENING STUDIES:  Last mammogram: ***  Last colonoscopy: *** Last bone mineral density: ***  MEDICATIONS: Outpatient Encounter Medications as of 03/05/2021  Medication Sig   acetaminophen (TYLENOL) 500 MG tablet Take 1,000 mg by mouth every 6 (six) hours as needed for headache (pain).   amLODipine (NORVASC) 10 MG tablet Take 1 tablet (10 mg total) by mouth daily.   METHADONE HCL PO Take 120 mg by mouth daily.   Multiple Vitamin (MULTIVITAMIN WITH MINERALS) TABS tablet Take 1 tablet by mouth daily. (Patient not taking: Reported on 02/08/2021)   No  facility-administered encounter medications on file as of 03/05/2021.    ALLERGIES:  Allergies  Allergen Reactions   Shellfish Allergy Hives and Other (See Comments)    Tightening of chest     FAMILY HISTORY:  Family History  Problem Relation Age of Onset   Kidney disease Mother    Diabetes Father    Hypertension Father      SOCIAL HISTORY:  Social Connections: Not on file    REVIEW OF SYSTEMS:  Denies appetite changes, fevers, chills, fatigue, unexplained weight changes. Denies hearing loss, neck lumps or masses, mouth sores, ringing in ears or voice changes. Denies cough or wheezing.  Denies shortness of breath. Denies chest pain or palpitations. Denies leg swelling. Denies abdominal distention, pain, blood in stools, constipation, diarrhea, nausea, vomiting, or early satiety. Denies pain with intercourse, dysuria, frequency, hematuria or incontinence. Denies hot flashes, pelvic pain, vaginal bleeding or vaginal discharge.   Denies joint pain, back pain or muscle pain/cramps. Denies itching, rash, or wounds. Denies dizziness, headaches, numbness or seizures. Denies swollen lymph nodes or glands, denies easy bruising or bleeding. Denies anxiety, depression, confusion, or decreased concentration.  Physical Exam:  Vital Signs for this encounter:  There were no vitals taken for this visit. There is no height or weight on file to calculate BMI. General: Alert, oriented, no acute distress.  HEENT: Normocephalic, atraumatic. Sclera anicteric.  Chest: Clear to auscultation bilaterally. No wheezes, rhonchi, or rales. Cardiovascular: Regular rate and rhythm, no murmurs, rubs,  or gallops.  Abdomen: ***Obese. Normoactive bowel sounds. Soft, nondistended, nontender to palpation. No masses or hepatosplenomegaly appreciated. No palpable fluid wave.  Extremities: Grossly normal range of motion. Warm, well perfused. No edema bilaterally.  Skin: No rashes or lesions.  Lymphatics: No  cervical, supraclavicular, or inguinal adenopathy.  GU:  Normal external female genitalia. ***  No lesions. No discharge or bleeding.             Bladder/urethra:  No lesions or masses, well supported bladder             Vagina: ***             Cervix: Normal appearing, no lesions.             Uterus: *** Small, mobile, no parametrial involvement or nodularity.             Adnexa: *** masses.  Rectal: ***  LABORATORY AND RADIOLOGIC DATA:  ***Outside medical records were reviewed to synthesize the above history, along with the history and physical obtained during the visit.   Lab Results  Component Value Date   WBC 16.6 (H) 02/12/2019   HGB 15.2 (H) 02/12/2019   HCT 43.9 02/12/2019   PLT 231 02/12/2019   GLUCOSE 120 (H) 02/12/2019   ALT 53 (H) 03/17/2019   AST 40 02/19/2019   NA 135 02/12/2019   K 3.4 (L) 02/12/2019   CL 97 (L) 02/12/2019   CREATININE 1.07 (H) 02/12/2019   BUN 20 02/12/2019   CO2 27 02/12/2019   HGBA1C 5.9 (A) 07/04/2020   Biopsies from 10/03/20: A. CERVIX, 12, 6, 7 AND 10 O'CLOCK, BIOPSY:  - High-grade squamous intraepithelial lesion, CIN-3 (severe dysplasia).   B. ENDOCERVIX, CURETTAGE:  - High-grade squamous intraepithelial lesion, CIN-3 (severe dysplasia).  - Scanty atrophic endocervical type glands.    Cervix, LEEP 02/08/21: A. 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  - Invasive carcinoma is <1 mm from the endocervical and deep margins.  - High grade squamous intraepithelial lesion involves the endocervical  margin broadly.   B. ENDOCERVIX, CURETTAGE:  - High grade squamous intraepithelial lesion, CIN-III (severe  dysplasia/CIS), see comment.   COMMENT:  A. The invasive carcinoma is near the cauterized endocervical margin but  is <1 mm from the endocervical and deep margins. The invasive component  is  estimated to have 2 mm depth of invasion and span 3 mm horizontally.  There is extensive CIN-III at the endocervical margin. Dr. Saralyn Pilar has  reviewed the case.

## 2021-03-09 ENCOUNTER — Telehealth: Payer: Self-pay | Admitting: *Deleted

## 2021-03-09 ENCOUNTER — Ambulatory Visit: Payer: Medicaid Other | Admitting: Family Medicine

## 2021-03-09 ENCOUNTER — Encounter: Payer: Self-pay | Admitting: *Deleted

## 2021-03-09 NOTE — Telephone Encounter (Signed)
Mailed the patient a letter regarding her missed new patient appt

## 2021-03-16 ENCOUNTER — Other Ambulatory Visit: Payer: Self-pay

## 2021-03-16 ENCOUNTER — Ambulatory Visit (HOSPITAL_COMMUNITY)
Admission: RE | Admit: 2021-03-16 | Discharge: 2021-03-16 | Disposition: A | Discharge status: Home / Self Care | Payer: Self-pay | Source: Ambulatory Visit | Attending: Obstetrics and Gynecology | Admitting: Obstetrics and Gynecology

## 2021-03-16 DIAGNOSIS — D069 Carcinoma in situ of cervix, unspecified: Secondary | ICD-10-CM | POA: Insufficient documentation

## 2021-03-16 LAB — GLUCOSE, CAPILLARY: Glucose-Capillary: 102 mg/dL — ABNORMAL HIGH (ref 70–99)

## 2021-03-16 MED ORDER — FLUDEOXYGLUCOSE F - 18 (FDG) INJECTION
8.6000 | Freq: Once | INTRAVENOUS | Status: AC
  Administered 2021-03-16: 8.62 via INTRAVENOUS

## 2021-03-26 ENCOUNTER — Telehealth: Payer: Self-pay

## 2021-03-26 NOTE — Telephone Encounter (Signed)
-----   Message from Aletha Halim, MD sent at 03/23/2021  8:52 AM EDT ----- The patient no show'ed to her gyn onc visit and to her follow up visit with [place]. Can you call her and follow up with her? Thanks

## 2021-03-26 NOTE — Telephone Encounter (Addendum)
Call placed to pt. Spoke with pt. Pt states had cancelled appt, but r/s PET scan. PET scan was done on 03/16/21. Pt advised to call back to [place] office to make follow up appt. Pt agreeable to plan of care. Number given to pt.  Will follow up with Dr Ilda Basset.  Colletta Maryland, RN   Routing to Dr Ilda Basset, for your review.

## 2021-04-30 ENCOUNTER — Telehealth: Payer: Self-pay | Admitting: Obstetrics and Gynecology

## 2021-04-30 NOTE — Telephone Encounter (Signed)
GYN Telephone Note Patient called at [phone] and no answer and VM not set up. Patient called again at same number a few minutes later and she picked up. She states she hadn't been able to see the GYN Oncologist on 10/17 because she had a lot going on and she states that she's just thinking about things and she's not wanting to put her body "through all that."  I told her we don't know what the next steps are but the earlier the options are d/w her the better her chance for a cure with the most minimally invasive treatment.  I told her that I will contact the Gyn Oncs to set up an appointment with her. I also told her that her VM is not set up.   Durene Romans MD Attending Center for [employer] (Faculty Practice) 04/30/2021 Time: 1300

## 2021-05-01 NOTE — Telephone Encounter (Signed)
I will call and get her rescheduled. Sorry for the late response.

## 2021-05-03 ENCOUNTER — Telehealth: Payer: Self-pay | Admitting: *Deleted

## 2021-05-03 NOTE — Telephone Encounter (Signed)
Attempted to reach the patient to schedule a new patient appt with Dr Berline Lopes. No answer and no voicemail.

## 2021-05-04 NOTE — Telephone Encounter (Signed)
Attempted to reach the patient to schedule a new patient appt with Dr Berline Lopes. No answer and no voicemail

## 2021-05-08 ENCOUNTER — Telehealth: Payer: Self-pay | Admitting: *Deleted

## 2021-05-08 NOTE — Telephone Encounter (Signed)
Attempted to reach the patient and schedule a new patient appt with Dr Berline Lopes, no answer and unable to leave a voicemail

## 2021-05-11 NOTE — Telephone Encounter (Addendum)
Attempted to reach the patient to schedule a new patient appt with Dr Berline Lopes. No answer and unable to leave a message; no voicemail.  Message sent to Dr Ilda Basset and his nurse

## 2021-05-21 ENCOUNTER — Other Ambulatory Visit: Payer: Self-pay | Admitting: Family Medicine

## 2021-05-21 DIAGNOSIS — I10 Essential (primary) hypertension: Secondary | ICD-10-CM

## 2021-06-06 ENCOUNTER — Telehealth: Payer: Self-pay | Admitting: Obstetrics and Gynecology

## 2021-06-06 ENCOUNTER — Telehealth: Payer: Self-pay | Admitting: *Deleted

## 2021-06-06 NOTE — Telephone Encounter (Signed)
Called pt and pt requested to her records.  I explained to the pt that she will need to come to the office to fill out ROI and the front office will provide her records.  Pt verbalized understanding.    Annette Jennings  06/06/21

## 2021-06-06 NOTE — Telephone Encounter (Signed)
Patient calling regarding her 02-08-2021 visit.

## 2021-06-06 NOTE — Telephone Encounter (Signed)
Patient called (very hard to hear on the phone) and sated "I need a letter from Dr Berline Lopes regarding my issues and when my next appt is going to be." Explained that the office will send Dr Berline Lopes a message that the office will call her back. (Patient requested to pick up the letter)

## 2021-06-13 NOTE — Telephone Encounter (Signed)
Per Dr Berline Lopes attempted to reach the patient regarding her letter, per Dr Berline Lopes the office hasn't seen the patient and can't fill the letter out. Patient will need to contact Dr Thomes Dinning office

## 2021-08-23 ENCOUNTER — Other Ambulatory Visit: Payer: Self-pay | Admitting: Family Medicine

## 2021-08-23 DIAGNOSIS — I10 Essential (primary) hypertension: Secondary | ICD-10-CM

## 2021-08-28 ENCOUNTER — Telehealth: Payer: Self-pay

## 2021-08-28 NOTE — Telephone Encounter (Addendum)
-----   Message from Aletha Halim, MD sent at 08/28/2021 11:30 AM EDT ----- ?Regarding: needs gyn onc appointment ?This patient has no show'ed to gyn onc multiple times because she doesn't believe she has cervical cancer. I don't know why she has an appointment with me in may, but please call her and tell her she needs to see onc and not me and set her up to see onc again. Thank you ? ?Dr. Ilda Basset ? ? ?Called pt; no answer. Called patient contact. Pt's son answers and states pt is staying with him currently. He will notify patient that we called. GYN/ONC appt needs to be rescheduled and a second attempt to contact patient needs to be made. ?

## 2021-08-30 NOTE — Telephone Encounter (Signed)
Called [phone] unable to leave VM due to not being set up.  Left message with son at [phone] that we have been trying to reach her if she could please give the office a call back.  Pt's son states that he just dropped her off in [place] and he will let her know to call the office.  I asked her son to please let the pt know to please leave in her message a good time that she can be reached.  He verbalized understanding.  ? ?Sloan Takagi,RN  ?08/30/21 ?

## 2021-09-04 NOTE — Telephone Encounter (Addendum)
Attempted to call patient to discuss need for Oncology appointment. She did not answer and voicemail not set up so not able to leave a message.  ? ?Called patients son and he reports to try her phone, informed him that she did not answer. He is planning to call patient to let her know that we really need to speak with her.  ? ?Patient called back within a few minutes and was very angry an nurse for calling her son. Reviewed with patient that her son was called as she did not answer calls and did not have voicemail or Mychart. She asked that son be taken off her chart, was completed by front office.  ? ?Patient reports she has not kept her appointment as she has other appointments. Advised her that she needs to follow up with Oncology as she does have Cervical Cancer. She asked when the next appointment is, asked her to call Oncology to reschedule at her convenience.  ? ?Per patient she cannot afford to get care. She reports she has Family Planning Medicaid that does not cover the services needed. She reports she keeps getting bills. Asked patient if she has applied for [facility] care, she thought she had, per Lorriane Shire at front desk, there is no application on file. Conveyed that to patient.  ? ?Reviewed with patient that she needs to follow up with Oncology instead of Dr. Ilda Basset. She then asked to have Dr. Ilda Basset to call her to discuss. Was not able to give her information on [facility] Application before she hung up.  ? ?Will route to Dr. Ilda Basset for advisement.  ? ? ? ? ?

## 2021-09-11 ENCOUNTER — Telehealth: Payer: Self-pay | Admitting: Obstetrics and Gynecology

## 2021-09-11 ENCOUNTER — Telehealth: Payer: Self-pay | Admitting: Lactation Services

## 2021-09-11 NOTE — Telephone Encounter (Signed)
Reached out to Jonna Clark, RN from [place], she reports that patient should qualify for Medicaid through [employer] to cover costws of treatment and will call her today to set up application.  ? ?Called patient and she reports she did speak with Dr. Ilda Basset and he wants her to see the Cancer Specialist. Offered to make appointment with [place] and patient reports she would like to call to make. She was given phone number to call and informed that Dr. Berline Lopes is the MD she has a previous appointment with.  ? ?Informed her that [employer] will call her today to work on application for coverage for care and to please answer the phone, patient voiced understanding. Advised she needs to call the [facility] today to schedule the appointment, she voiced understanding.  ?

## 2021-09-11 NOTE — Telephone Encounter (Signed)
GYN Telephone Note ?Patient covered at [phone] after seeing her nurse note from last week. ? ?I told her that she is in the [place] program so care after her 08/24/20 should be covered, but I asked the nurses to help her with this.  ? ?Patient states she has questions about any treatment and I told her that that is what the appointment with specialists at [facility] are for and we will again set this up for her again ? ?Durene Romans MD ?Attending ?Center for [employer] Fish farm manager) ?09/11/2021 ?Time: 11am ? ?

## 2021-09-11 NOTE — Telephone Encounter (Signed)
-----   Message from Royston Bake, [place] sent at 09/11/2021 11:51 AM EDT ----- ?Regarding: RE: she has [employer] ?Hey, ? ?We did try to reach out to her last year to complete her Medicaid application but we were unable to reach her. We can still do Medicaid for her and it will retro-act back 90 days. We will try and reach out to her again today. It looks like she was supposed to see Dr. Berline Lopes at the [facility] last October. It is important for her to get this rescheduled ASAP. ? ?Thanks, ? 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 ?Hello ladies, can you guys weigh in on this? She has Pregnancy Medicaid and is concerned with costs of Oncology care. She does not have a [facility] on file.  ? ?Thanks ?Ivin Booty, RN ?----- Message ----- ?From: Aletha Halim, MD ?Sent: 09/11/2021  10:54 AM EDT ?To: Donn Pierini, RN, [facility] Clinical Pool ?Subject: she has bcccp                                 ? ?Ivin Booty, I saw your last note, and the reason the patient is now giving for not coming for her Oncology care is because she isn't covered. She has [employer] so all this is covered. Any bills she has, she can talk to [facility] about this, but treatment for her cervical cancer is covered under [employer]cp ? ?Please cancel her appt with me in May and get her set up, again, with Oncology ? ?Thank ? ?Dr. Ilda Basset ? ? ? ? ?

## 2021-09-18 ENCOUNTER — Telehealth: Payer: Self-pay | Admitting: Lactation Services

## 2021-09-18 ENCOUNTER — Telehealth: Payer: Self-pay | Admitting: *Deleted

## 2021-09-18 NOTE — Telephone Encounter (Signed)
Ivin Booty, RN from Dr Ilda Basset called and rescheduled the patient missed appt from October. Sharon RN given 5/26 at 10:30 am with the arrival time of 10 am. ?

## 2021-09-18 NOTE — Telephone Encounter (Signed)
Per chart review patient has not schedule appointment at the [facility] as we discussed last week.  ? ? ?Have spoke with Jonna Clark, CMA and Clemencia Course, LPN with [employer]. Per Clemencia Course, LPN paperwork for [facility] application is waiting at the [facility]. May 26 at 10:30, arrive at 10 am and they will call her a few days before appointment.  ? ?Called patient and she reports she knows when she is to go and that she is planning to go see a specialist and we need to quit calling her. She would not divulge what specialist, or when or where.   ? ?Informed patient that an appointment was made at the [facility] for May 26 and to arrive at 10 am. Reviewed paperwork is at the [place] to sign for Medicaid through [place]. Patient informed that appointment with Dr. Ilda Basset for 5/18 has been cancelled per Dr. Ilda Basset.  ? ? ? ? ?

## 2021-09-21 ENCOUNTER — Other Ambulatory Visit: Payer: Self-pay | Admitting: Family Medicine

## 2021-09-21 DIAGNOSIS — I10 Essential (primary) hypertension: Secondary | ICD-10-CM

## 2021-10-04 ENCOUNTER — Ambulatory Visit: Payer: Medicaid Other | Admitting: Obstetrics and Gynecology

## 2021-10-11 ENCOUNTER — Telehealth: Payer: Self-pay

## 2021-10-11 NOTE — Telephone Encounter (Signed)
I tried calling patient today regarding doing meaningful use for upcoming appointment with Dr.Tucker tomorrow. Unable to leave message due to voicemail not set up. KJ CMA

## 2021-10-12 ENCOUNTER — Inpatient Hospital Stay: Payer: Medicaid Other | Attending: Gynecologic Oncology | Admitting: Gynecologic Oncology

## 2021-10-12 ENCOUNTER — Telehealth: Payer: Self-pay

## 2021-10-12 DIAGNOSIS — C539 Malignant neoplasm of cervix uteri, unspecified: Secondary | ICD-10-CM

## 2021-10-12 NOTE — Telephone Encounter (Signed)
Patient did not show for 10/12/2021 [facility] appointment, and did not sign [employer] Medicaid forms. Patient was previously informed that she will be able to sign the Medicaid form at this visit.

## 2021-10-16 ENCOUNTER — Telehealth: Payer: Self-pay | Admitting: *Deleted

## 2021-10-16 NOTE — Telephone Encounter (Signed)
Attempted to reach the patient to reschedule her missed appt. Phone went straight to voice mail and no VM set up

## 2021-10-17 NOTE — Telephone Encounter (Signed)
Attempted to reach the patient to reschedule her missed appt. Phone went straight to voice mail and no VM set up

## 2021-10-18 NOTE — Telephone Encounter (Signed)
Attempted to reach the patient to reschedule her missed appt. Phone went straight to voice mail and no VM set up.   Message routed to Dr Ilda Basset

## 2021-10-25 ENCOUNTER — Other Ambulatory Visit: Payer: Self-pay | Admitting: Family Medicine

## 2021-10-25 DIAGNOSIS — I10 Essential (primary) hypertension: Secondary | ICD-10-CM

## 2021-11-25 ENCOUNTER — Other Ambulatory Visit: Payer: Self-pay | Admitting: Family Medicine

## 2021-11-25 DIAGNOSIS — I10 Essential (primary) hypertension: Secondary | ICD-10-CM

## 2021-12-11 ENCOUNTER — Other Ambulatory Visit: Payer: Self-pay | Admitting: Family Medicine

## 2021-12-11 ENCOUNTER — Telehealth: Payer: Self-pay

## 2021-12-11 DIAGNOSIS — I10 Essential (primary) hypertension: Secondary | ICD-10-CM

## 2021-12-11 MED ORDER — AMLODIPINE BESYLATE 10 MG PO TABS: ORAL_TABLET | ORAL | 0 refills | Status: DC

## 2021-12-11 NOTE — Progress Notes (Signed)
Erroneous encounter

## 2021-12-11 NOTE — Telephone Encounter (Signed)
Patient calls nurse line reporting she will run out of her BP medications before apt.   Apt is scheduled for 8/3 with PCP.   Can we send her in another weeks worth of Amlodipine.   Will forward to PCP.

## 2021-12-20 ENCOUNTER — Ambulatory Visit: Payer: Medicaid Other | Admitting: Family Medicine

## 2021-12-25 ENCOUNTER — Other Ambulatory Visit: Payer: Self-pay

## 2021-12-25 DIAGNOSIS — I10 Essential (primary) hypertension: Secondary | ICD-10-CM

## 2021-12-25 MED ORDER — AMLODIPINE BESYLATE 10 MG PO TABS: ORAL_TABLET | ORAL | 0 refills | Status: DC

## 2021-12-25 NOTE — Telephone Encounter (Signed)
Patient calls nurse line requesting medication refill on amlodipine. Patient states that she "should not be receiving bread crumb refills"   Advised patient that last refill was a partial in order to last until follow up appointment. Patient states that she had to reschedule last appointment due to being out of town and she is in need of more medication.   Patient has follow up scheduled for 8/17. Please advise.   Talbot Grumbling, RN

## 2022-01-03 ENCOUNTER — Ambulatory Visit (INDEPENDENT_AMBULATORY_CARE_PROVIDER_SITE_OTHER): Payer: Self-pay | Admitting: Family Medicine

## 2022-01-03 ENCOUNTER — Encounter: Payer: Self-pay | Admitting: Family Medicine

## 2022-01-03 VITALS — BP 154/107 | HR 106 | Ht 67.0 in | Wt 177.8 lb

## 2022-01-03 DIAGNOSIS — I10 Essential (primary) hypertension: Secondary | ICD-10-CM

## 2022-01-03 DIAGNOSIS — G8929 Other chronic pain: Secondary | ICD-10-CM

## 2022-01-03 DIAGNOSIS — M545 Low back pain, unspecified: Secondary | ICD-10-CM

## 2022-01-03 MED ORDER — AMLODIPINE-OLMESARTAN 10-20 MG PO TABS: 1.0000 | ORAL_TABLET | Freq: Every day | ORAL | 0 refills | Status: DC

## 2022-01-03 NOTE — Patient Instructions (Addendum)
It was nice to meet you today! Today, we discussed:  High blood pressure - your medication was switched to Azor (amlodipine-olmesartan 10-'20mg'$  combination pill). You should take this daily and keep a good log of all your blood pressures. Please bring your blood pressure log with you at your follow up visit in 2-3 weeks Low back pain - your back pain is most likely related to muscle strain. Please continue to stretch and exercise the area, and use Voltaren gel as needed. You can also alternate between Tylenol and ibuprofen for pain.  Thanks, August Albino, MD

## 2022-01-03 NOTE — Assessment & Plan Note (Signed)
Most likely muscular in nature, no focal tenderness and no red flag symptoms - Continue with stretching/exercises - Tylenol and Ibuprofen alternating for pain - Voltaren gel as needed  - No red flag symptoms to causes concern for neural impingement or cauda equina  - Return if symptoms persists after conservative treatment in 1-2 months

## 2022-01-03 NOTE — Progress Notes (Signed)
    SUBJECTIVE:   CHIEF COMPLAINT / HPI:   Essential hypertension -Home BP has ranged from 117/70 - 150/90 over the past couple of months, no log with her today -Occasionally has headache with higher BP, denies vision changes, CP/SOB, N/V, abdominal pain, dysuria -Stressors include living in "toxic" environment at extended stay, does not feel 100% safe there due to occasional harrassment from others -Drinks coffee once per morning   Low back pain -Achy pain across lower back when she's been standing for too long -denies dysuria, fevers, incontinence, saddle anesthesia, radicular pain -Has tried voltaren gel which helps. Doesn't like using tylenol/ibuprofen. Hasn't tried heating pads  PERTINENT  PMH / PSH: HTN  OBJECTIVE:   BP (!) 154/107   Pulse (!) 106   Ht '5\' 7"'$  (1.702 m)   Wt 177 lb 12.8 oz (80.6 kg)   LMP  (LMP Unknown)   SpO2 100%   BMI 27.85 kg/m   Gen: Well appearing, NAD CV: RRR, no MRG Pulm: CTAB, normal WOB on RA MSK: No focal tenderness to palpation of vertebrae or paraspinal muscles, normal ROM in back  ASSESSMENT/PLAN:   Essential hypertension BP elevated 154/107 today. Runs high at home as well. On amlodipine '10mg'$  daily currently. No significant red flag symptoms concerning for hypertensive emergency. -Switch to amlodipine - olmesartan (Azor) '10mg'$  - '20mg'$  qd -BMP today -f/u in 2-3 weeks for BMP recheck (starting ARB) and BP check  Back pain Most likely muscular in nature, no focal tenderness and no red flag symptoms - Continue with stretching/exercises - Tylenol and Ibuprofen alternating for pain - Voltaren gel as needed  - No red flag symptoms to causes concern for neural impingement or cauda equina  - Return if symptoms persists after conservative treatment in 1-2 months         Annette Albino, MD [place]

## 2022-01-03 NOTE — Assessment & Plan Note (Addendum)
BP elevated 154/107 today. Runs high at home as well. On amlodipine '10mg'$  daily currently. No significant red flag symptoms concerning for hypertensive emergency. -Switch to amlodipine - olmesartan (Azor) '10mg'$  - '20mg'$  qd -BMP today -f/u in 2-3 weeks for BMP recheck (starting ARB) and BP check

## 2022-01-04 LAB — BASIC METABOLIC PANEL
BUN/Creatinine Ratio: 8 — ABNORMAL LOW (ref 9–23)
BUN: 8 mg/dL (ref 6–24)
CO2: 20 mmol/L (ref 20–29)
Calcium: 10.3 mg/dL — ABNORMAL HIGH (ref 8.7–10.2)
Chloride: 96 mmol/L (ref 96–106)
Creatinine, Ser: 1.01 mg/dL — ABNORMAL HIGH (ref 0.57–1.00)
Glucose: 92 mg/dL (ref 70–99)
Potassium: 4.6 mmol/L (ref 3.5–5.2)
Sodium: 139 mmol/L (ref 134–144)
eGFR: 67 mL/min/{1.73_m2} (ref >59)

## 2022-01-23 NOTE — Progress Notes (Deleted)
    SUBJECTIVE:   CHIEF COMPLAINT / HPI: Blood pressure follow-up    ***  PERTINENT  PMH / PSH: ***  OBJECTIVE:   LMP  (LMP Unknown)   ***  ASSESSMENT/PLAN:   Essential hypertension Patient started on Azor 10/20 mg at last visit.  BMP unremarkable aside from very mildly elevated creatinine at that time. -Recheck BMP today -Consider titration of Azor to 10/40 mg if not at blood pressure goal     Annette Jennings, [place]

## 2022-01-23 NOTE — Assessment & Plan Note (Deleted)
Patient started on Azor 10/20 mg at last visit.  BMP unremarkable aside from very mildly elevated creatinine at that time. -Recheck BMP today -Consider titration of Azor to 10/40 mg if not at blood pressure goal

## 2022-01-24 ENCOUNTER — Telehealth: Payer: Self-pay | Admitting: Family Medicine

## 2022-01-24 ENCOUNTER — Ambulatory Visit: Payer: Medicaid Other | Admitting: Family Medicine

## 2022-01-24 DIAGNOSIS — I10 Essential (primary) hypertension: Secondary | ICD-10-CM

## 2022-01-24 NOTE — Telephone Encounter (Signed)
Patient called to reschedule her appointment for today. She also wanted to let Dr. Joeseph Amor know the new BP medication is working great and she has been keeping a log of BP's. Told patient to bring log with her to her appointment next Friday.

## 2022-01-26 ENCOUNTER — Other Ambulatory Visit: Payer: Self-pay | Admitting: Family Medicine

## 2022-01-26 DIAGNOSIS — I10 Essential (primary) hypertension: Secondary | ICD-10-CM

## 2022-01-30 NOTE — Progress Notes (Deleted)
    SUBJECTIVE:   CHIEF COMPLAINT / HPI:   HTN f/u -Switched from amlodipine '10mg'$  qd to Azor 10-20 qd, pt reports good adherence*** -Home BP *** -Consider titrate to 10/40 per pharmacy, consider SGLT2, consider pharmacy referral***   PERTINENT  PMH / PSH: ***  OBJECTIVE:   LMP  (LMP Unknown)   ***  ASSESSMENT/PLAN:   No problem-specific Assessment & Plan notes found for this encounter.     August Albino, MD [place]

## 2022-02-01 ENCOUNTER — Ambulatory Visit: Payer: Medicaid Other | Admitting: Family Medicine

## 2022-02-01 DIAGNOSIS — I10 Essential (primary) hypertension: Secondary | ICD-10-CM

## 2022-02-11 ENCOUNTER — Ambulatory Visit: Payer: Medicaid Other | Admitting: Family Medicine

## 2022-02-20 NOTE — Progress Notes (Deleted)
    SUBJECTIVE:   CHIEF COMPLAINT / HPI:   HTN f/u -Switched from amlodipine '10mg'$  qd to Azor 10-20 qd, pt reports good adherence*** -Home BP *** -Consider titrate to 10/40 per pharmacy, consider SGLT2, consider pharmacy referral***   PERTINENT  PMH / PSH: ***  OBJECTIVE:   LMP  (LMP Unknown)   ***  ASSESSMENT/PLAN:   No problem-specific Assessment & Plan notes found for this encounter.     August Albino, MD [place]

## 2022-02-21 ENCOUNTER — Ambulatory Visit: Payer: Medicaid Other | Admitting: Family Medicine

## 2022-02-26 ENCOUNTER — Other Ambulatory Visit: Payer: Self-pay | Admitting: Family Medicine

## 2022-02-26 DIAGNOSIS — I10 Essential (primary) hypertension: Secondary | ICD-10-CM

## 2022-03-07 NOTE — Progress Notes (Signed)
    SUBJECTIVE:   CHIEF COMPLAINT / HPI:   HTN -Has been taking Azor as prescribed. However, ran out of her meds and did not take her med today. Also took a Zyrtec for allergies which she thinks might have raised her blood pressure a little -Has been checking her BP in the mornings and evenings -Range is from 120s-130s/80s-90s (no log with her today) -Denies regular headaches, dizziness, lightheadedness, vision changes -Denies swelling in legs and/or face or around lips -Denies abd pain, CP/SOB, N/V  Notably, patient reports that she has moved into a new house and feels much safer than she did before at her old place.  She will be living with her boyfriend once he moves in.  OBJECTIVE:   BP (!) 160/92   Pulse 77   Ht '5\' 7"'$  (1.702 m)   Wt 178 lb 3.2 oz (80.8 kg)   LMP  (LMP Unknown)   SpO2 99%   BMI 27.91 kg/m    Gen: NAD, sitting in chair, alert and conversational HEENT: No facial swelling CV: RRR no MRG Pulm: CTAB normal WOB on RA Ext: No lower extremity edema  ASSESSMENT/PLAN:   Essential hypertension Started on Azor 10-20 at last visit.  Blood pressure 156/92 (160/92 on recheck) but patient reports that she just ran out of her medication and was not able to take her dose today.  She has been under her goal of 140/90 at home, so feel comfortable continuing her current medication.  Was hoping to check BMP today but patient would like to defer this until her next visit.   -Continue Azor, follow-up in 2 months -Check BMP, likely A1c next visit     August Albino, MD [place]

## 2022-03-08 ENCOUNTER — Encounter: Payer: Self-pay | Admitting: Family Medicine

## 2022-03-08 ENCOUNTER — Ambulatory Visit (INDEPENDENT_AMBULATORY_CARE_PROVIDER_SITE_OTHER): Payer: Self-pay | Admitting: Family Medicine

## 2022-03-08 VITALS — BP 160/92 | HR 77 | Ht 67.0 in | Wt 178.2 lb

## 2022-03-08 DIAGNOSIS — I10 Essential (primary) hypertension: Secondary | ICD-10-CM

## 2022-03-08 MED ORDER — AMLODIPINE-OLMESARTAN 10-20 MG PO TABS: 1.0000 | ORAL_TABLET | Freq: Every day | ORAL | 1 refills | Status: DC

## 2022-03-08 NOTE — Patient Instructions (Addendum)
It was wonderful to see you today.  Please bring ALL of your medications with you to every visit.   Updates from today's visit:  Please continue taking your blood pressure medicine and keeping a good log at home. I have refilled your medicine. Please bring your log with you at your next visit I would like to check your labwork at your next visit I'm very happy that you moved into a more comfortable home!  Please follow up in 2 months   Thank you for choosing [facility].   Please call [phone] with any questions about today's appointment.  Please be sure to schedule follow up at the front  desk before you leave today.   August Albino, MD  Family Medicine

## 2022-03-08 NOTE — Assessment & Plan Note (Addendum)
Started on Azor 10-20 at last visit.  Blood pressure 156/92 (160/92 on recheck) but patient reports that she just ran out of her medication and was not able to take her dose today.  She has been under her goal of 140/90 at home, so feel comfortable continuing her current medication.  Was hoping to check BMP today but patient would like to defer this until her next visit.   -Continue Azor, follow-up in 2 months -Check BMP, likely A1c next visit

## 2022-03-19 ENCOUNTER — Telehealth: Payer: Medicaid Other | Admitting: Family Medicine

## 2022-03-19 NOTE — Telephone Encounter (Signed)
Called patient, no answer- unable to leave message

## 2022-03-19 NOTE — Telephone Encounter (Signed)
Patient with known cervical cancer, has not engaged in treatment due to a variety of issues, some of which have been financial.   Please call patient and discuss impending expansion of Medicaid and urge her to make an appointment with Gyn Onc for discussion of treatment of her known cervical cancer.

## 2022-03-20 NOTE — Telephone Encounter (Signed)
Called patient, no answer- unable to leave message as voicemail was not set up. Attempted to reach out to emergency contact, but no answer

## 2022-03-21 NOTE — Telephone Encounter (Signed)
I called Decklyn and heard a message " the person you are trying to reach does not have a voice mail set up. I also called her contact Mateo Flow and left a message I am trying to reach Kite and you are listed as a contact, please have her call our office re: important information and we need to speak to her - she should call our office and ask to speak to a nurse. Staci Acosta

## 2022-03-25 NOTE — Telephone Encounter (Signed)
Called pt and heard message stating that the person called has a voice mailbox which has not been set up yet.

## 2022-03-28 NOTE — Telephone Encounter (Signed)
Called patient, no answer- message stated her voicemail box has not been set up yet.

## 2022-04-03 IMAGING — PT NM PET TUM IMG INITIAL (PI) SKULL BASE T - THIGH
1 series · 11 of 11 positions shown · non-contrast
Comparison: Chest CT from 4949.

CLINICAL DATA: Initial treatment strategy for cervical cancer.

EXAM:
NUCLEAR MEDICINE PET SKULL BASE TO THIGH
TECHNIQUE: 8.62 mCi F-18 FDG was injected intravenously. Full-ring PET imaging
was performed from the skull base to thigh after the radiotracer. CT
data was obtained and used for attenuation correction and anatomic
localization.
Fasting blood glucose: 102 mg/dl

[Series 1060: results mm oncology reading · 1.0mm · 0.50mm/px · 11 of 11 slices shown]
[im 1/11]
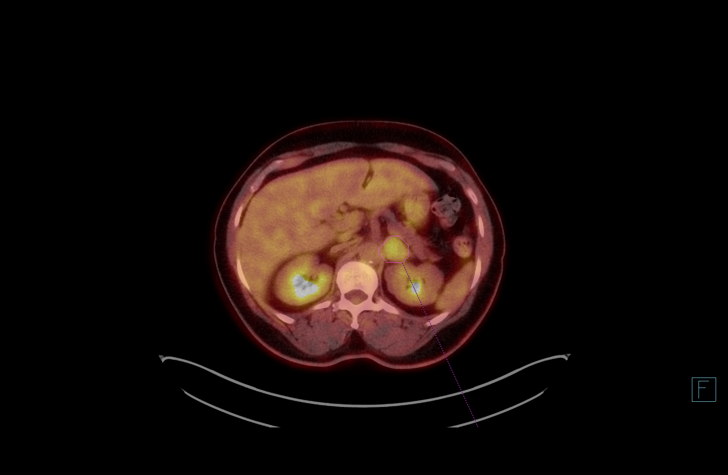
[im 2/11]
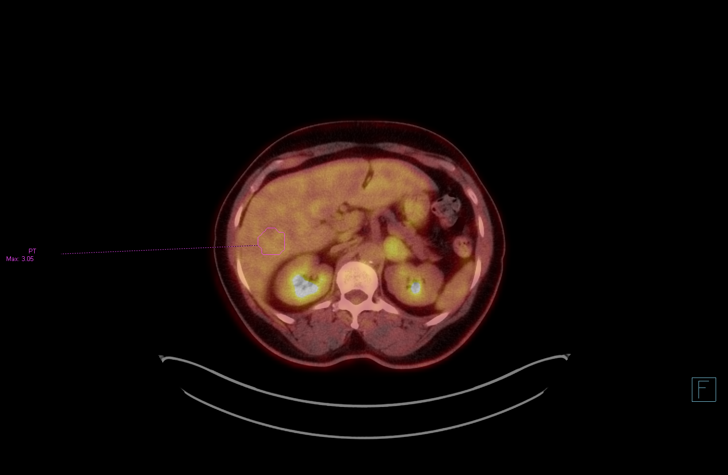
[im 3/11]
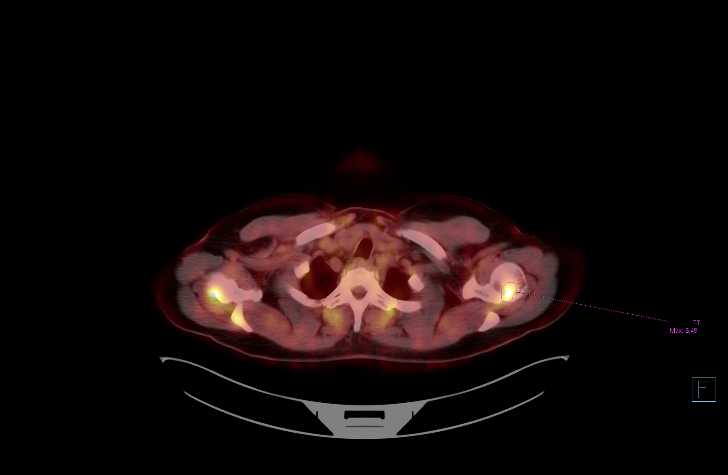
[im 4/11]
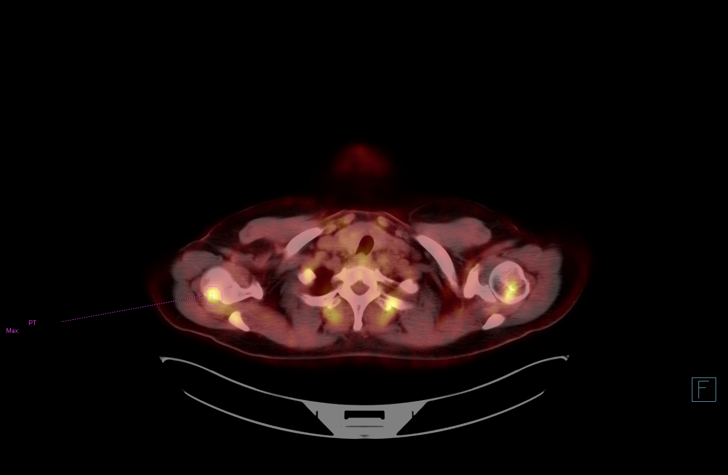
[im 5/11]
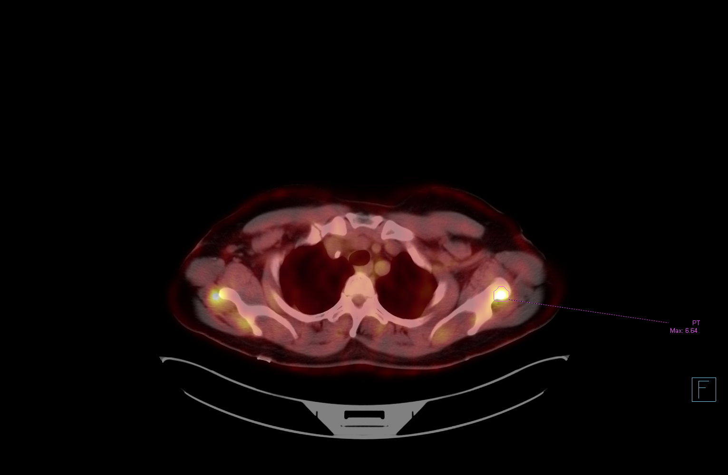
[im 6/11]
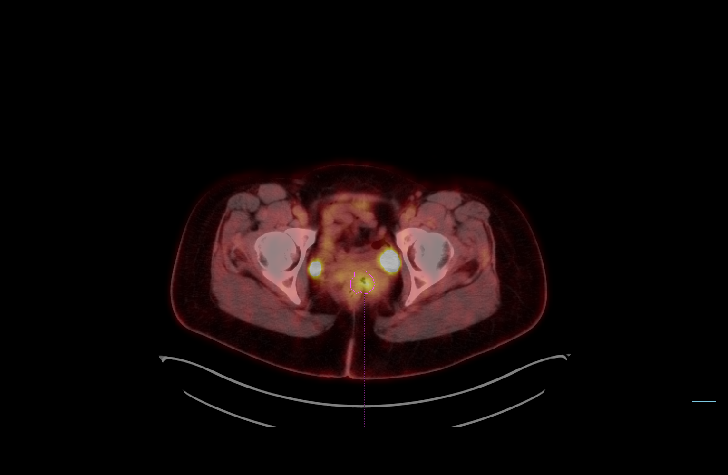
[im 7/11]
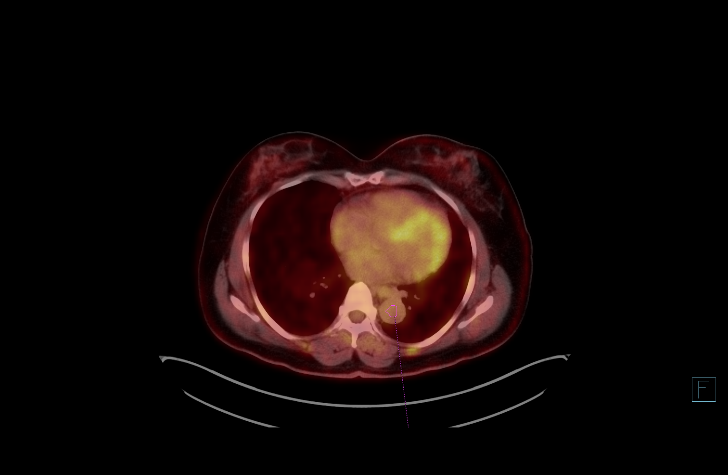
[im 8/11]
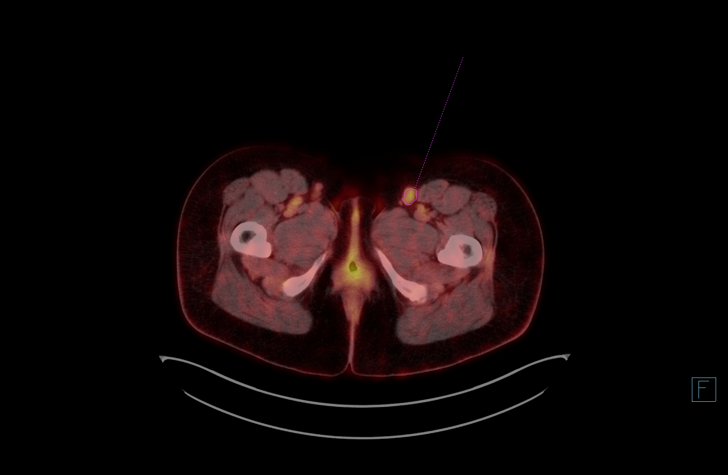
[im 9/11]
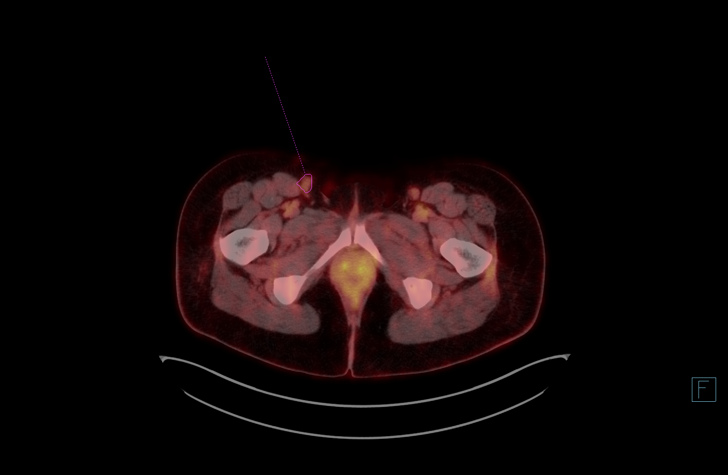
[im 10/11]
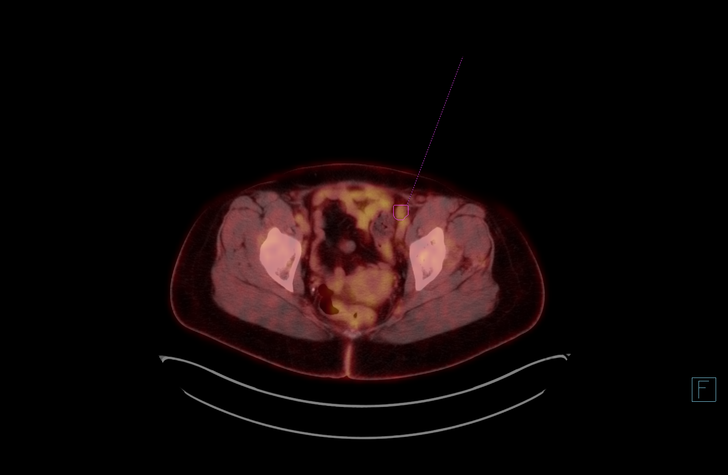
[im 11/11]
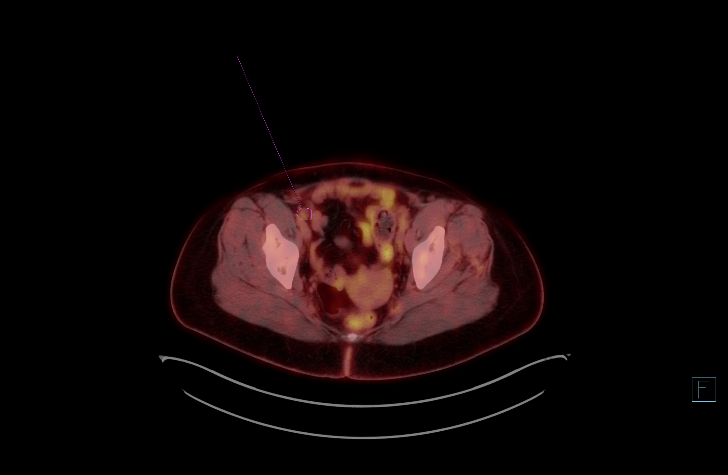

[11 of 11 positions shown; findings below may reference images not displayed]

FINDINGS: Mediastinal blood pool activity: SUV max

Liver activity: SUV max NA

NECK: No hypermetabolic lymph nodes in the neck.

Incidental CT findings: none

CHEST: No hypermetabolic mediastinal or hilar nodes. No suspicious
pulmonary nodules on the CT scan.

No breast masses, supraclavicular axillary adenopathy.

Incidental CT findings: none

ABDOMEN/PELVIS: There is a 2.8 cm left adrenal gland lesion which
demonstrates mild hypermetabolism. SUV max is 4.41 compared to
background liver a 3.05. This was also present on a prior chest CT
from 4949 where it measured 2.3 cm. It measures 29 Hounsfield units
and is most likely a lipid poor adenoma. Recommend attention on
future studies.

No hepatic lesions. No enlarged or hypermetabolic abdominal/pelvic
lymph nodes.

Very small focus of mild hypermetabolism in the region of the cervix
with SUV max of 4.90. 8 mm left external iliac lymph on image 159/4
has an SUV max of 2.45 right external iliac node on image 158/4
measures 7.5 mm and SUV max is 2.12. There is a 12.5 mm left
inguinal node which has an SUV max of 3.32. 8.5 mm right inguinal
has an SUV max of 1.79. These are most likely reactive/inflammatory
but attention on future studies is suggested.

Incidental CT findings: Scattered vascular calcifications, advanced
for age.

SKELETON: No focal hypermetabolic activity to suggest skeletal
metastasis. Moderate areas of muscular uptake in the neck, shoulders
and upper thorax likely due to muscular activity.

Incidental CT findings: none
IMPRESSION: 1. Small focus of FDG uptake in the region of the cervix likely
correlating with patient's known cervical cancer. This could also
could be related to biopsy, surgery or cryosurgery.
2. Small weakly hypermetabolic external iliac and inguinal nodes
likely inflammatory/reactive. No findings suspicious for metastatic
disease.
3. Left adrenal gland lesion is slightly larger when compared to
4949 but is likely a benign lipid poor adenoma. Attention on future
studies is suggested.

## 2022-04-15 ENCOUNTER — Other Ambulatory Visit: Payer: Self-pay

## 2022-04-15 DIAGNOSIS — I10 Essential (primary) hypertension: Secondary | ICD-10-CM

## 2022-04-15 MED ORDER — AMLODIPINE-OLMESARTAN 10-20 MG PO TABS: 1.0000 | ORAL_TABLET | Freq: Every day | ORAL | 1 refills | Status: DC

## 2022-05-08 ENCOUNTER — Encounter: Payer: Self-pay | Admitting: General Practice

## 2022-08-08 ENCOUNTER — Other Ambulatory Visit: Payer: Self-pay

## 2022-08-08 DIAGNOSIS — I10 Essential (primary) hypertension: Secondary | ICD-10-CM

## 2022-08-08 MED ORDER — AMLODIPINE-OLMESARTAN 10-20 MG PO TABS: 1.0000 | ORAL_TABLET | Freq: Every day | ORAL | 1 refills | Status: DC

## 2023-02-03 ENCOUNTER — Telehealth: Payer: Self-pay | Admitting: Pharmacist

## 2023-02-03 NOTE — Telephone Encounter (Signed)
Completed call to patient for follow-up of blood pressure/hypertension control.   Scheduled appointment for 02/10/23.  Total time with patient call and documentation of interaction: 3 minutes.

## 2023-02-10 ENCOUNTER — Ambulatory Visit: Payer: Medicaid Other | Admitting: Pharmacist

## 2023-09-04 ENCOUNTER — Telehealth: Payer: Self-pay

## 2023-09-04 NOTE — Transitions of Care (Post Inpatient/ED Visit) (Signed)
   09/04/2023  Name: Annette Jennings MRN: [id number] DOB: November 16, 1970  Today's TOC FU Call Status: Today's TOC FU Call Status:: Successful TOC FU Call Completed TOC FU Call Complete Date: 09/04/23 Patient's Name and Date of Birth confirmed.  Transition Care Management Follow-up Telephone Call Date of Discharge: 09/03/23 Discharge Facility: [facility] Type of Discharge: Inpatient Admission Primary Inpatient Discharge Diagnosis:: nausea vomiting How have you been since you were released from the hospital?: Better Any questions or concerns?: No  Items Reviewed: Did you receive and understand the discharge instructions provided?: Yes Medications obtained,verified, and reconciled?: Yes (Medications Reviewed) Any new allergies since your discharge?: No Dietary orders reviewed?: Yes Do you have support at home?: Yes People in Home [RPT]: significant other  Medications Reviewed Today: Medications Reviewed Today     Reviewed by Darrall Ellison, LPN (Licensed Practical Nurse) on 09/04/23 at [phone]  Med List Status: <None>   Medication Order Taking? Sig Documenting Provider Last Dose Status Informant  amlodipine-olmesartan (AZOR) 10-20 MG tablet [id number]  Take 1 tablet by mouth daily. Edison Gore, MD  Active   METHADONE HCL PO [id number]  Take 120 mg by mouth daily. [provider]  Active Self           Med Note Neila Bally, HEATHER L   Sat Feb 06, 2019  2:08 PM) Verified dose from pt's bottle from [facility] [phone]). Pt picks up 6 doses every Thursday and states that she has not yet taken today's dose.  metoprolol tartrate (LOPRESSOR) 25 MG tablet [id number] Yes Take 25 mg by mouth 2 (two) times daily. [provider]  Active   Multiple Vitamin (MULTIVITAMIN WITH MINERALS) TABS tablet [id number]  Take 1 tablet by mouth daily. [provider]  Active Self            Home Care and Equipment/Supplies: Were Home Health Services  Ordered?: NA Any new equipment or medical supplies ordered?: NA  Functional Questionnaire: Do you need assistance with bathing/showering or dressing?: No Do you need assistance with meal preparation?: No Do you need assistance with eating?: No Do you have difficulty maintaining continence: No Do you need assistance with getting out of bed/getting out of a chair/moving?: No Do you have difficulty managing or taking your medications?: No  Follow up appointments reviewed: PCP Follow-up appointment confirmed?: No (declined) MD Provider Line [phone] Given: No Specialist Hospital Follow-up appointment confirmed?: NA Do you need transportation to your follow-up appointment?: No Do you understand care options if your condition(s) worsen?: Yes-patient verbalized understanding    SIGNATURE Darrall Ellison, LPN [facility] Nurse Health Advisor Direct Dial [phone]

## 2023-09-05 ENCOUNTER — Other Ambulatory Visit: Payer: Self-pay | Admitting: Family Medicine

## 2023-09-05 DIAGNOSIS — I10 Essential (primary) hypertension: Secondary | ICD-10-CM
